# Patient Record
Sex: Male | Born: 1985 | Race: Black or African American | Hispanic: No | Marital: Single | Smoking: Current every day smoker
Health system: Southern US, Community
[De-identification: ages and names within clinical notes are randomized; demographics above are authoritative.]

## PROBLEM LIST (undated history)

## (undated) DIAGNOSIS — J449 Chronic obstructive pulmonary disease, unspecified: Secondary | ICD-10-CM

## (undated) DIAGNOSIS — F1010 Alcohol abuse, uncomplicated: Secondary | ICD-10-CM

## (undated) DIAGNOSIS — K259 Gastric ulcer, unspecified as acute or chronic, without hemorrhage or perforation: Secondary | ICD-10-CM

## (undated) DIAGNOSIS — M419 Scoliosis, unspecified: Secondary | ICD-10-CM

---

## 2016-12-26 ENCOUNTER — Emergency Department (HOSPITAL_COMMUNITY)
Admission: EM | Admit: 2016-12-26 | Discharge: 2016-12-26 | Disposition: A | Discharge status: Home / Self Care | Payer: Self-pay | Attending: Emergency Medicine | Admitting: Emergency Medicine

## 2016-12-26 ENCOUNTER — Emergency Department (HOSPITAL_COMMUNITY): Payer: Self-pay

## 2016-12-26 DIAGNOSIS — R0789 Other chest pain: Secondary | ICD-10-CM

## 2016-12-26 DIAGNOSIS — Z880 Allergy status to penicillin: Secondary | ICD-10-CM | POA: Insufficient documentation

## 2016-12-26 DIAGNOSIS — F410 Panic disorder [episodic paroxysmal anxiety] without agoraphobia: Secondary | ICD-10-CM

## 2016-12-26 LAB — BASIC METABOLIC PANEL
ANION GAP: 9 (ref 5–15)
BUN: 6 mg/dL (ref 6–20)
CHLORIDE: 103 mmol/L (ref 101–111)
CO2: 27 mmol/L (ref 22–32)
Calcium: 9.6 mg/dL (ref 8.9–10.3)
Creatinine, Ser: 1.17 mg/dL (ref 0.61–1.24)
GFR calc Af Amer: >60 mL/min (ref >60)
GLUCOSE: 99 mg/dL (ref 65–99)
POTASSIUM: 3.8 mmol/L (ref 3.5–5.1)
Sodium: 139 mmol/L (ref 135–145)

## 2016-12-26 LAB — CBC
HEMATOCRIT: 41.7 % (ref 39.0–52.0)
HEMOGLOBIN: 14.2 g/dL (ref 13.0–17.0)
MCH: 29.6 pg (ref 26.0–34.0)
MCHC: 34.1 g/dL (ref 30.0–36.0)
MCV: 86.9 fL (ref 78.0–100.0)
Platelets: 248 10*3/uL (ref 150–400)
RBC: 4.8 MIL/uL (ref 4.22–5.81)
RDW: 13.3 % (ref 11.5–15.5)
WBC: 5.3 10*3/uL (ref 4.0–10.5)

## 2016-12-26 LAB — I-STAT TROPONIN, ED: Troponin i, poc: 0 ng/mL (ref 0.00–0.08)

## 2016-12-26 MED ORDER — SODIUM CHLORIDE 0.9 % IV BOLUS (SEPSIS)
1000.0000 mL | Freq: Once | INTRAVENOUS | Status: AC
  Administered 2016-12-26: 1000 mL via INTRAVENOUS

## 2016-12-26 MED ORDER — HYDROXYZINE HCL 25 MG PO TABS: 25.0000 mg | ORAL_TABLET | Freq: Four times a day (QID) | ORAL | 0 refills | Status: DC | PRN

## 2016-12-26 MED ORDER — GI COCKTAIL ~~LOC~~
30.0000 mL | Freq: Once | ORAL | Status: AC
  Administered 2016-12-26: 30 mL via ORAL
  Filled 2016-12-26: qty 30

## 2016-12-26 MED ORDER — LORAZEPAM 2 MG/ML IJ SOLN
1.0000 mg | Freq: Once | INTRAMUSCULAR | Status: AC
  Administered 2016-12-26: 1 mg via INTRAVENOUS
  Filled 2016-12-26: qty 1

## 2016-12-26 NOTE — ED Triage Notes (Signed)
Pt from home via [facility]. Pt called out c/o central CP x1hr ago. Rates pain @ 6/10. Endorse n/v, weakness, & SHOB. Hx of diabetes, htn, & smoking. Ambulatory to bed. Given 324 ASA PTA by EMS. A&O x4.

## 2016-12-26 NOTE — ED Provider Notes (Signed)
[facility]-EMERGENCY DEPT Provider Note   CSN: [id number] Arrival date & time: 12/26/16  0617     History   Chief Complaint Chief Complaint  Patient presents with  . Chest Pain    HPI Monica BectonMichael Garden is a 31 y.o. male.  Pt presents to the ED today with CP.  He said it feels like he is having panic attacks that come and go.  He feels like he's going to die, then it gets hard to breathe.  Then he gets numb in his face and his hands "lock up."  Right now, he feels ok, but he thinks it might come back.  Triage nurse said pt has a hx of htn and diabetes.  Pt denies this to me.  He has not been here before.  He does smoke cigarettes.      No past medical history on file.  There are no active problems to display for this patient.   No past surgical history on file.     Home Medications    Prior to Admission medications   Medication Sig Start Date End Date Taking? Authorizing Provider  hydrOXYzine (ATARAX/VISTARIL) 25 MG tablet Take 1 tablet (25 mg total) by mouth every 6 (six) hours as needed for anxiety. 12/26/16   Jacalyn LefevreHaviland, Arneshia Ade, MD    Family History No family history on file.  Social History Social History  Substance Use Topics  . Smoking status: Not on file  . Smokeless tobacco: Not on file  . Alcohol use Not on file     Allergies   Penicillins   Review of Systems Review of Systems  Respiratory: Positive for shortness of breath.   Cardiovascular: Positive for chest pain.  Psychiatric/Behavioral: The patient is nervous/anxious.   All other systems reviewed and are negative.    Physical Exam Updated Vital Signs BP 115/75   Pulse 74   Temp 98.5 F (36.9 C) (Oral)   Resp (!) 28   Ht 6' (1.829 m)   Wt 83.9 kg (185 lb)   SpO2 100%   BMI 25.09 kg/m   Physical Exam  Constitutional: He is oriented to person, place, and time. He appears well-developed and well-nourished.  HENT:  Head: Normocephalic and atraumatic.  Right Ear: External ear normal.  Left  Ear: External ear normal.  Nose: Nose normal.  Mouth/Throat: Oropharynx is clear and moist.  Eyes: Pupils are equal, round, and reactive to light. Conjunctivae and EOM are normal.  Neck: Normal range of motion. Neck supple.  Cardiovascular: Normal rate, regular rhythm, normal heart sounds and intact distal pulses.   Pulmonary/Chest: Effort normal and breath sounds normal.  Abdominal: Soft. Bowel sounds are normal.  Musculoskeletal: Normal range of motion.  Neurological: He is alert and oriented to person, place, and time.  Skin: Skin is warm.  Psychiatric: He has a normal mood and affect. His behavior is normal. Judgment and thought content normal.  Nursing note and vitals reviewed.    ED Treatments / Results  Labs (all labs ordered are listed, but only abnormal results are displayed) Labs Reviewed  BASIC METABOLIC PANEL  CBC  I-STAT TROPONIN, ED    EKG  EKG Interpretation  Date/Time:  Monday December 26 2016 06:29:16 EDT Ventricular Rate:  67 PR Interval:    QRS Duration: 86 QT Interval:  362 QTC Calculation: 383 R Axis:   93 Text Interpretation:  Sinus rhythm Inferior infarct, acute (LCx) No old tracing to compare Confirmed by Jacalyn LefevreHaviland, Deeya Richeson [phone]([id number]) on 12/26/2016 7:03:36 AM Also  confirmed by Jacalyn LefevreHaviland, Charleene Callegari [phone]([id number]), editor Madalyn RobEverhart, Marilyn [phone])  on 12/26/2016 8:12:02 AM       Radiology Dg Chest 2 View  Result Date: 12/26/2016 CLINICAL DATA:  Chest pain. EXAM: CHEST  2 VIEW COMPARISON:  No prior . FINDINGS: Mediastinum and hilar structures normal. Heart size normal. No focal infiltrate. No pleural effusion or pneumothorax. IMPRESSION: No acute cardiopulmonary disease . Electronically Signed   By: Maisie Fushomas  Register   On: 12/26/2016 07:15    Procedures Procedures (including critical care time)  Medications Ordered in ED Medications  LORazepam (ATIVAN) injection 1 mg (1 mg Intravenous Given 12/26/16 0804)  sodium chloride 0.9 % bolus 1,000 mL (1,000 mLs Intravenous New  Bag/Given 12/26/16 0803)     Initial Impression / Assessment and Plan / ED Course  I have reviewed the triage vital signs and the nursing notes.  Pertinent labs & imaging results that were available during my care of the patient were reviewed by me and considered in my medical decision making (see chart for details).  Pt is feeling much better.  He wants to go home to charge his phone to talk to his girlfriend.  He was offered our landline, but wants to talk to her on an app.  Pt's cp is very atypical.  He is low risk for CAD.  Sx sound like panic attack.  Work up here is unremarkable.  He is encouraged to stop smoking.  Return if worse.  Final Clinical Impressions(s) / ED Diagnoses   Final diagnoses:  Atypical chest pain  Panic attack    New Prescriptions New Prescriptions   HYDROXYZINE (ATARAX/VISTARIL) 25 MG TABLET    Take 1 tablet (25 mg total) by mouth every 6 (six) hours as needed for anxiety.     Jacalyn LefevreHaviland, Leighanna Kirn, MD 12/26/16 [phone]

## 2017-06-01 ENCOUNTER — Encounter (HOSPITAL_COMMUNITY): Payer: Self-pay | Admitting: Emergency Medicine

## 2017-06-01 ENCOUNTER — Emergency Department (HOSPITAL_COMMUNITY)
Admission: EM | Admit: 2017-06-01 | Discharge: 2017-06-01 | Discharge status: Against Medical Advice | Payer: Self-pay | Attending: Emergency Medicine | Admitting: Emergency Medicine

## 2017-06-01 ENCOUNTER — Other Ambulatory Visit: Payer: Self-pay

## 2017-06-01 DIAGNOSIS — Z5321 Procedure and treatment not carried out due to patient leaving prior to being seen by health care provider: Secondary | ICD-10-CM | POA: Insufficient documentation

## 2017-06-01 DIAGNOSIS — J069 Acute upper respiratory infection, unspecified: Secondary | ICD-10-CM | POA: Insufficient documentation

## 2017-06-01 HISTORY — DX: Gastric ulcer, unspecified as acute or chronic, without hemorrhage or perforation: K25.9
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
## 2017-06-01 NOTE — ED Notes (Signed)
Unable to locate patient in lobby x 2 

## 2017-06-01 NOTE — ED Notes (Signed)
Patient no answer x2 when called for triage.

## 2017-06-06 ENCOUNTER — Emergency Department (HOSPITAL_COMMUNITY): Payer: Self-pay

## 2017-06-06 ENCOUNTER — Other Ambulatory Visit: Payer: Self-pay

## 2017-06-06 ENCOUNTER — Encounter (HOSPITAL_COMMUNITY): Payer: Self-pay | Admitting: Emergency Medicine

## 2017-06-06 DIAGNOSIS — F17200 Nicotine dependence, unspecified, uncomplicated: Secondary | ICD-10-CM | POA: Insufficient documentation

## 2017-06-06 DIAGNOSIS — J111 Influenza due to unidentified influenza virus with other respiratory manifestations: Secondary | ICD-10-CM | POA: Insufficient documentation

## 2017-06-06 LAB — COMPREHENSIVE METABOLIC PANEL
ALK PHOS: 116 U/L (ref 38–126)
ALT: 33 U/L (ref 17–63)
ANION GAP: 8 (ref 5–15)
AST: 31 U/L (ref 15–41)
Albumin: 4 g/dL (ref 3.5–5.0)
BILIRUBIN TOTAL: 1.6 mg/dL — AB (ref 0.3–1.2)
BUN: 6 mg/dL (ref 6–20)
CALCIUM: 9.6 mg/dL (ref 8.9–10.3)
CO2: 27 mmol/L (ref 22–32)
CREATININE: 1.14 mg/dL (ref 0.61–1.24)
Chloride: 101 mmol/L (ref 101–111)
GFR calc non Af Amer: >60 mL/min (ref >60)
Glucose, Bld: 95 mg/dL (ref 65–99)
Potassium: 3.7 mmol/L (ref 3.5–5.1)
Sodium: 136 mmol/L (ref 135–145)
TOTAL PROTEIN: 7.6 g/dL (ref 6.5–8.1)

## 2017-06-06 LAB — CBC
HCT: 42.6 % (ref 39.0–52.0)
Hemoglobin: 14.2 g/dL (ref 13.0–17.0)
MCH: 30 pg (ref 26.0–34.0)
MCHC: 33.3 g/dL (ref 30.0–36.0)
MCV: 89.9 fL (ref 78.0–100.0)
PLATELETS: 272 10*3/uL (ref 150–400)
RBC: 4.74 MIL/uL (ref 4.22–5.81)
RDW: 13.2 % (ref 11.5–15.5)
WBC: 8.7 10*3/uL (ref 4.0–10.5)

## 2017-06-06 LAB — URINALYSIS, ROUTINE W REFLEX MICROSCOPIC
BILIRUBIN URINE: NEGATIVE
Glucose, UA: NEGATIVE mg/dL
Hgb urine dipstick: NEGATIVE
Ketones, ur: NEGATIVE mg/dL
Leukocytes, UA: NEGATIVE
NITRITE: NEGATIVE
PROTEIN: NEGATIVE mg/dL
SPECIFIC GRAVITY, URINE: 1.016 (ref 1.005–1.030)
pH: 8 (ref 5.0–8.0)

## 2017-06-06 LAB — LIPASE, BLOOD: Lipase: 26 U/L (ref 11–51)

## 2017-06-06 NOTE — ED Triage Notes (Signed)
Per EMS, pt has been feeling "sick" x 2 weeks. Pt c/o nausea, vomiting, diarrhea and a productive cough.

## 2017-06-07 ENCOUNTER — Emergency Department (HOSPITAL_COMMUNITY)
Admission: EM | Admit: 2017-06-07 | Discharge: 2017-06-07 | Disposition: A | Discharge status: Home / Self Care | Payer: Self-pay | Attending: Emergency Medicine | Admitting: Emergency Medicine

## 2017-06-07 DIAGNOSIS — R6889 Other general symptoms and signs: Secondary | ICD-10-CM

## 2017-06-07 MED ORDER — PREDNISONE 20 MG PO TABS: 40.0000 mg | ORAL_TABLET | Freq: Every day | ORAL | 0 refills | Status: DC

## 2017-06-07 MED ORDER — GUAIFENESIN-CODEINE 100-10 MG/5ML PO SYRP: 5.0000 mL | ORAL_SOLUTION | Freq: Three times a day (TID) | ORAL | 0 refills | Status: DC | PRN

## 2017-06-07 MED ORDER — ACETAMINOPHEN 325 MG PO TABS
650.0000 mg | ORAL_TABLET | Freq: Once | ORAL | Status: AC
Start: 2017-06-07 — End: 2017-06-07
  Administered 2017-06-07: 650 mg via ORAL
  Filled 2017-06-07: qty 2

## 2017-06-07 MED ORDER — IBUPROFEN 400 MG PO TABS
600.0000 mg | ORAL_TABLET | Freq: Once | ORAL | Status: AC
Start: 2017-06-07 — End: 2017-06-07
  Administered 2017-06-07: 600 mg via ORAL
  Filled 2017-06-07: qty 1

## 2017-06-07 MED ORDER — ONDANSETRON 4 MG PO TBDP
4.0000 mg | ORAL_TABLET | Freq: Once | ORAL | Status: AC
Start: 2017-06-07 — End: 2017-06-07
  Administered 2017-06-07: 4 mg via ORAL
  Filled 2017-06-07: qty 1

## 2017-06-07 MED ORDER — CYCLOBENZAPRINE HCL 10 MG PO TABS: 10.0000 mg | ORAL_TABLET | Freq: Two times a day (BID) | ORAL | 0 refills | Status: DC | PRN

## 2017-06-07 MED ORDER — CYCLOBENZAPRINE HCL 10 MG PO TABS
5.0000 mg | ORAL_TABLET | Freq: Once | ORAL | Status: AC
  Administered 2017-06-07: 5 mg via ORAL
  Filled 2017-06-07: qty 1

## 2017-06-07 MED ORDER — KETOROLAC TROMETHAMINE 60 MG/2ML IM SOLN
60.0000 mg | Freq: Once | INTRAMUSCULAR | Status: AC
  Administered 2017-06-07: 60 mg via INTRAMUSCULAR
  Filled 2017-06-07: qty 2

## 2017-06-07 NOTE — ED Notes (Signed)
Pt called for vitals reassessment, no response.  

## 2017-06-07 NOTE — ED Notes (Signed)
PT in bed taking rapid, shallow breaths and crying. PT reports pain has worsened. PT accepts PO meds.

## 2017-06-07 NOTE — Discharge Instructions (Signed)
Your lab work and chest xray were reassuring.   Please take ibuprofen at home as needed for body aches. You can take tylenol as needed for fever and chills.   Please drink plenty of fluids and rest as much as you can.   I have written you a prescription for a steroid medicine called prednisone. Please take 40mg  daily for the next 5 days.   I have also written you a prescription for muscle relaxer medicine called Flexeril.  This medicine can make you drowsy so please do not drive, work or drink alcohol while taking it.  I [facility].  They are a clinic located across the street and accept patients who do not have insurance.  Please call to establish care.  Return to the emergency department if you develop worsening cough with shortness of breath, vomiting that will not stop or have any new or worsening symptoms.

## 2017-06-07 NOTE — ED Provider Notes (Signed)
[facility] EMERGENCY DEPARTMENT Provider Note   CSN: [id number] Arrival date & time: 06/06/17  1939     History   Chief Complaint Chief Complaint  Patient presents with  . Nausea    HPI Steven Mooney is a 32 y.o. male.  HPI   Steven Mooney is a 32 year old male with a history of anxiety who presents to the emergency department for evaluation of cough, congestion, fever, body aches, sore throat, nausea and vomiting.  Patient states that he has felt sick for the past 2 weeks now.  Reports that his cough is productive of a brown/yellow sputum.  He also endorses chills and tactile fever at home.  His body aches are primarily located over the bilateral back and legs.  Reports that for the last 3 days he has had nausea with several episodes of vomiting after trying to eat or drink.  States that his sore throat is intermittent. He has not taken any over the counter medications for his symptoms. States that he also has several close contacts who are sick.  Denies headache, ear pain, trismus, dysphagia, chest pain, shortness of breath, abdominal pain, dysuria, urinary frequency, loss of bowel or bladder control, numbness. He states he feels generally weak and fatigued overall.   Past Medical History:  Diagnosis Date  . Gastric ulcer     There are no active problems to display for this patient.   History reviewed. No pertinent surgical history.     Home Medications    Prior to Admission medications   Medication Sig Start Date End Date Taking? Authorizing Provider  hydrOXYzine (ATARAX/VISTARIL) 25 MG tablet Take 1 tablet (25 mg total) by mouth every 6 (six) hours as needed for anxiety. 12/26/16   Jacalyn Lefevre, MD    Family History No family history on file.  Social History Social History   Tobacco Use  . Smoking status: Current Every Day Smoker  . Smokeless tobacco: Never Used  Substance Use Topics  . Alcohol use: Yes  . Drug use: Yes    Types:  Marijuana     Allergies   Penicillins   Review of Systems Review of Systems  Constitutional: Positive for chills, fatigue and fever.  HENT: Positive for congestion, rhinorrhea and sore throat. Negative for ear pain, facial swelling, nosebleeds, trouble swallowing and voice change.   Eyes: Negative for visual disturbance.  Respiratory: Positive for cough. Negative for shortness of breath and wheezing.   Cardiovascular: Negative for chest pain.  Gastrointestinal: Positive for nausea and vomiting. Negative for abdominal pain and diarrhea.  Genitourinary: Negative for difficulty urinating, dysuria and flank pain.  Musculoskeletal: Positive for back pain and myalgias (generalized body aches). Negative for gait problem.  Skin: Negative for rash.  Neurological: Negative for headaches.  Psychiatric/Behavioral: Negative for agitation.     Physical Exam Updated Vital Signs BP 127/80   Pulse 88   Temp 98.7 F (37.1 C) (Oral)   Resp 13   Ht 6' (1.829 m)   Wt 83 kg (183 lb)   SpO2 98%   BMI 24.82 kg/m   Physical Exam  Constitutional: He is oriented to person, place, and time. He appears well-developed and well-nourished. No distress.  HENT:  Head: Normocephalic and atraumatic.  Right Ear: External ear normal.  Left Ear: External ear normal.  Mouth/Throat: No oropharyngeal exudate.  Bilateral TMs with good cone of light. Mucous membranes moist. Posterior oropharynx non-erythematous. No tonsillar exudate. Uvula midline. Airway patent. Able to handle oral secretions.  Eyes: Conjunctivae are normal. Pupils are equal, round, and reactive to light. Right eye exhibits no discharge. Left eye exhibits no discharge. No scleral icterus.  Neck: Normal range of motion. Neck supple.  Cardiovascular: Normal rate, regular rhythm and intact distal pulses. Exam reveals no friction rub.  No murmur heard. Pulmonary/Chest: Effort normal and breath sounds normal. No stridor. No respiratory distress.  He has no wheezes. He has no rales.  Abdominal: Soft. Bowel sounds are normal. There is no tenderness. There is no guarding.  Musculoskeletal: Normal range of motion.  No midline T-spine or L-spine tenderness. Grossly tender to palpation over the bilateral upper and lower back. No rash, bruising or break in skin. Strength 5/5 in bilateral knee flexion/extension and ankle dorsiflexion/plantar flexion. DP pulses 2+ bilaterally.   Lymphadenopathy:    He has no cervical adenopathy.  Neurological: He is alert and oriented to person, place, and time. Coordination normal.  Patellar reflex 2+ bilaterally. Sensation to light touch intact in bilateral distal LE. Gait normal in balance and coordination.   Skin: Skin is warm and dry. Capillary refill takes less than 2 seconds. He is not diaphoretic.  Psychiatric: He has a normal mood and affect. His behavior is normal.  Nursing note and vitals reviewed.    ED Treatments / Results  Labs (all labs ordered are listed, but only abnormal results are displayed) Labs Reviewed  COMPREHENSIVE METABOLIC PANEL - Abnormal; Notable for the following components:      Result Value   Total Bilirubin 1.6 (*)    All other components within normal limits  LIPASE, BLOOD  CBC  URINALYSIS, ROUTINE W REFLEX MICROSCOPIC    EKG  EKG Interpretation None       Radiology Dg Chest 2 View  Result Date: 06/06/2017 CLINICAL DATA:  Nausea, vomiting and productive cough EXAM: CHEST  2 VIEW COMPARISON:  12/26/2016 FINDINGS: Normal heart size. Right aortic arch. Otherwise unremarkable mediastinal contours. The lungs are clear. Mild S-shaped scoliosis. IMPRESSION: 1. Clear lungs. 2. Right aortic arch. Electronically Signed   By: Marnee SpringJonathon  Watts M.D.   On: 06/06/2017 23:07    Procedures Procedures (including critical care time)  Medications Ordered in ED Medications - No data to display   Initial Impression / Assessment and Plan / ED Course  I have reviewed the triage  vital signs and the nursing notes.  Pertinent labs & imaging results that were available during my care of the patient were reviewed by me and considered in my medical decision making (see chart for details).    Patient with symptoms consistent with influenza.  Vitals are stable, low-grade fever.  No signs of dehydration, tolerating PO's.  Lungs are clear. Chest Xray without acute abnormality.  Discussed the cost versus benefit of Tamiflu treatment with the patient.  The patient understands that symptoms are greater than the recommended 24-48 hour window of treatment.  Patient will be discharged with instructions to orally hydrate, rest, and use over-the-counter medications such as anti-inflammatories ibuprofen and Aleve for muscle aches and Tylenol for fever. Patient will also be given a cough suppressant.  He does not have a primary care doctor, have given him information to follow-up and [facility] at [facility].  Discussed return precautions the patient agrees and voiced understanding.  Final Clinical Impressions(s) / ED Diagnoses   Final diagnoses:  Flu-like symptoms    ED Discharge Orders    None       Kellie ShropshireShrosbree, Kymoni Monday J, PA-C 06/07/17 2052  Phillis Haggis, MD 06/08/17 [phone]

## 2017-06-11 ENCOUNTER — Emergency Department (HOSPITAL_COMMUNITY)
Admission: EM | Admit: 2017-06-11 | Discharge: 2017-06-11 | Disposition: A | Discharge status: Home / Self Care | Payer: Self-pay | Attending: Emergency Medicine | Admitting: Emergency Medicine

## 2017-06-11 ENCOUNTER — Encounter (HOSPITAL_COMMUNITY): Payer: Self-pay | Admitting: *Deleted

## 2017-06-11 ENCOUNTER — Other Ambulatory Visit: Payer: Self-pay

## 2017-06-11 DIAGNOSIS — Z79899 Other long term (current) drug therapy: Secondary | ICD-10-CM | POA: Insufficient documentation

## 2017-06-11 DIAGNOSIS — K625 Hemorrhage of anus and rectum: Secondary | ICD-10-CM | POA: Insufficient documentation

## 2017-06-11 DIAGNOSIS — F17210 Nicotine dependence, cigarettes, uncomplicated: Secondary | ICD-10-CM | POA: Insufficient documentation

## 2017-06-11 LAB — URINALYSIS, ROUTINE W REFLEX MICROSCOPIC
BILIRUBIN URINE: NEGATIVE
Glucose, UA: NEGATIVE mg/dL
Hgb urine dipstick: NEGATIVE
KETONES UR: NEGATIVE mg/dL
LEUKOCYTES UA: NEGATIVE
NITRITE: NEGATIVE
PROTEIN: NEGATIVE mg/dL
Specific Gravity, Urine: 1.001 — ABNORMAL LOW (ref 1.005–1.030)
pH: 6 (ref 5.0–8.0)

## 2017-06-11 LAB — CBC
HCT: 43.7 % (ref 39.0–52.0)
Hemoglobin: 14.9 g/dL (ref 13.0–17.0)
MCH: 31.2 pg (ref 26.0–34.0)
MCHC: 34.1 g/dL (ref 30.0–36.0)
MCV: 91.6 fL (ref 78.0–100.0)
Platelets: 291 10*3/uL (ref 150–400)
RBC: 4.77 MIL/uL (ref 4.22–5.81)
RDW: 13.5 % (ref 11.5–15.5)
WBC: 12 10*3/uL — AB (ref 4.0–10.5)

## 2017-06-11 LAB — COMPREHENSIVE METABOLIC PANEL
ALK PHOS: 115 U/L (ref 38–126)
ALT: 27 U/L (ref 17–63)
ANION GAP: 14 (ref 5–15)
AST: 20 U/L (ref 15–41)
Albumin: 3.9 g/dL (ref 3.5–5.0)
BILIRUBIN TOTAL: 0.7 mg/dL (ref 0.3–1.2)
BUN: 7 mg/dL (ref 6–20)
CALCIUM: 9.3 mg/dL (ref 8.9–10.3)
CO2: 25 mmol/L (ref 22–32)
Chloride: 99 mmol/L — ABNORMAL LOW (ref 101–111)
Creatinine, Ser: 1.21 mg/dL (ref 0.61–1.24)
GFR calc Af Amer: >60 mL/min (ref >60)
GLUCOSE: 114 mg/dL — AB (ref 65–99)
Potassium: 3.3 mmol/L — ABNORMAL LOW (ref 3.5–5.1)
Sodium: 138 mmol/L (ref 135–145)
TOTAL PROTEIN: 6.8 g/dL (ref 6.5–8.1)

## 2017-06-11 LAB — POC OCCULT BLOOD, ED: FECAL OCCULT BLD: NEGATIVE

## 2017-06-11 LAB — LIPASE, BLOOD: LIPASE: 35 U/L (ref 11–51)

## 2017-06-11 NOTE — Discharge Instructions (Signed)
Please start taking miralax daily, this is available over the counter.   Please stop taking any ibuprofen until this resolves.  If you develop abdominal pain, nausea, vomiting, or have any concerns please return to the emergency room.

## 2017-06-11 NOTE — ED Provider Notes (Signed)
[facility] EMERGENCY DEPARTMENT Provider Note   CSN: [id number] Arrival date & time: 06/11/17  0046     History   Chief Complaint Chief Complaint  Patient presents with  . Abdominal Pain    HPI Steven Mooney is a 32 y.o. male who presents today for evaluation of blood in his stool.  He reports that for the past month he has had blood in his stool, and notes that today he had a bowel movement that was reportedly only blood without stool.  He denies any abdominal pain, states that he has back pain that is been present for multiple weeks.  He denies any lightheadedness or dizziness.  No recent trauma.  He denies any diarrhea or constipation.  He does have a remote history of gastric ulcers, however says that that was "years ago" and that this does not feel similar.  He does report that he was recently diagnosed with influenza-like illness which has been getting better.  No fevers or chills, no dysuria hematuria, or other urinary concerns.  He denies any trauma history.  Denies any rectal insertions. HPI  Past Medical History:  Diagnosis Date  . Gastric ulcer     There are no active problems to display for this patient.   History reviewed. No pertinent surgical history.     Home Medications    Prior to Admission medications   Medication Sig Start Date End Date Taking? Authorizing Provider  cyclobenzaprine (FLEXERIL) 10 MG tablet Take 1 tablet (10 mg total) by mouth 2 (two) times daily as needed for muscle spasms. Patient not taking: Reported on 06/11/2017 06/07/17   Kellie Shropshire, PA-C  guaiFENesin-codeine (ROBITUSSIN AC) 100-10 MG/5ML syrup Take 5 mLs by mouth 3 (three) times daily as needed for cough. Patient not taking: Reported on 06/11/2017 06/07/17   Kellie Shropshire, PA-C  hydrOXYzine (ATARAX/VISTARIL) 25 MG tablet Take 1 tablet (25 mg total) by mouth every 6 (six) hours as needed for anxiety. Patient not taking: Reported on 06/07/2017 12/26/16    Jacalyn Lefevre, MD  predniSONE (DELTASONE) 20 MG tablet Take 2 tablets (40 mg total) by mouth daily. Patient not taking: Reported on 06/11/2017 06/07/17   Kellie Shropshire, PA-C    Family History No family history on file.  Social History Social History   Tobacco Use  . Smoking status: Current Every Day Smoker  . Smokeless tobacco: Never Used  Substance Use Topics  . Alcohol use: Yes  . Drug use: Yes    Types: Marijuana     Allergies   Penicillins   Review of Systems Review of Systems  Constitutional: Negative for chills and fever.  HENT: Negative for ear pain and sore throat.   Eyes: Negative for pain and visual disturbance.  Respiratory: Negative for cough and shortness of breath.   Cardiovascular: Negative for chest pain and palpitations.  Gastrointestinal: Positive for abdominal pain and blood in stool. Negative for vomiting.  Genitourinary: Negative for dysuria and hematuria.  Musculoskeletal: Negative for arthralgias and back pain.  Skin: Negative for color change and rash.  Neurological: Negative for seizures and syncope.  All other systems reviewed and are negative.    Physical Exam Updated Vital Signs BP 125/62   Pulse 94   Temp 98.2 F (36.8 C) (Oral)   Resp 16   SpO2 98%   Physical Exam  Constitutional: He appears well-developed and well-nourished.  HENT:  Head: Normocephalic and atraumatic.  Eyes: Conjunctivae are normal.  Neck: Neck supple.  Cardiovascular: Normal rate and regular rhythm.  No murmur heard. Pulmonary/Chest: Effort normal and breath sounds normal. No respiratory distress.  Abdominal: Soft. Normal appearance and bowel sounds are normal. There is no tenderness. There is no rigidity, no rebound and no guarding.  There is tenderness to palpation over lower thoracic/upper lumbar back that is over the CVA areas, however pain is present with light palpation, and is worse with pressure then percussion.  These muscles feel tight  consistent with spasm.  Genitourinary:  Genitourinary Comments: Exam performed with chaperone in room.  There are no external hemorrhoids or fissures/skin tears.  No masses or abnormalities felt with DRE.  Hemoccult negative.  No frank blood or melena.  Musculoskeletal: He exhibits no edema.  Neurological: He is alert.  Skin: Skin is warm and dry.  Psychiatric: He has a normal mood and affect.  Nursing note and vitals reviewed.    ED Treatments / Results  Labs (all labs ordered are listed, but only abnormal results are displayed) Labs Reviewed  COMPREHENSIVE METABOLIC PANEL - Abnormal; Notable for the following components:      Result Value   Potassium 3.3 (*)    Chloride 99 (*)    Glucose, Bld 114 (*)    All other components within normal limits  CBC - Abnormal; Notable for the following components:   WBC 12.0 (*)    All other components within normal limits  URINALYSIS, ROUTINE W REFLEX MICROSCOPIC - Abnormal; Notable for the following components:   Color, Urine STRAW (*)    Specific Gravity, Urine 1.001 (*)    All other components within normal limits  LIPASE, BLOOD  OCCULT BLOOD X 1 CARD TO LAB, STOOL  POC OCCULT BLOOD, ED    EKG  EKG Interpretation None       Radiology No results found.  Procedures Procedures (including critical care time)  Medications Ordered in ED Medications - No data to display   Initial Impression / Assessment and Plan / ED Course  I have reviewed the triage vital signs and the nursing notes.  Pertinent labs & imaging results that were available during my care of the patient were reviewed by me and considered in my medical decision making (see chart for details).    Steven Mooney presents today for evaluation of bright red blood per rectum.  Hemoglobin Is normal, Hemoccult negative.  He does not have any abdominal pain or tenderness.  His reported "kidney" pain appears more consistent with paraspinal muscle spasm as the pain is  worse with pressure than with percussion.  Urine without hematuria or abnormalities.  Given no external source of bleeding, with able hemoglobin I feel that patient is appropriate for outpatient GI follow-up.  He is hemodynamically stable at this time.  CT scan abdomen was considered, however given lack of abdominal pain, emergent CT not indicated.  He was given instructions on MiraLAX to help soften bowel movements and GI follow-up.  He was given return precautions, states his understanding. Patient discussed with Dr. Bebe ShaggyWickline who agrees with my plan.   Final Clinical Impressions(s) / ED Diagnoses   Final diagnoses:  Bright red blood per rectum    ED Discharge Orders    None       Cristina GongHammond, Vencent Hauschild W, PA-C 06/11/17 0344    Zadie RhineWickline, Donald, MD 06/11/17 [phone]

## 2017-06-11 NOTE — ED Triage Notes (Signed)
Pt BIB  EMS c/o blood in stool. Has been having diarrhea, Reports bright red blood in the toilet tonight without stool. Denies abd pain, is having back pain

## 2017-09-25 ENCOUNTER — Encounter (HOSPITAL_COMMUNITY): Payer: Self-pay | Admitting: Emergency Medicine

## 2017-09-25 ENCOUNTER — Emergency Department (HOSPITAL_COMMUNITY): Payer: Self-pay

## 2017-09-25 ENCOUNTER — Emergency Department (HOSPITAL_COMMUNITY)
Admission: EM | Admit: 2017-09-25 | Discharge: 2017-09-25 | Disposition: A | Discharge status: Home / Self Care | Payer: Self-pay | Attending: Emergency Medicine | Admitting: Emergency Medicine

## 2017-09-25 DIAGNOSIS — M62830 Muscle spasm of back: Secondary | ICD-10-CM | POA: Insufficient documentation

## 2017-09-25 DIAGNOSIS — R112 Nausea with vomiting, unspecified: Secondary | ICD-10-CM | POA: Insufficient documentation

## 2017-09-25 DIAGNOSIS — M546 Pain in thoracic spine: Secondary | ICD-10-CM | POA: Insufficient documentation

## 2017-09-25 DIAGNOSIS — K920 Hematemesis: Secondary | ICD-10-CM

## 2017-09-25 DIAGNOSIS — F17200 Nicotine dependence, unspecified, uncomplicated: Secondary | ICD-10-CM | POA: Insufficient documentation

## 2017-09-25 DIAGNOSIS — R1011 Right upper quadrant pain: Secondary | ICD-10-CM | POA: Insufficient documentation

## 2017-09-25 DIAGNOSIS — K2920 Alcoholic gastritis without bleeding: Secondary | ICD-10-CM | POA: Insufficient documentation

## 2017-09-25 LAB — COMPREHENSIVE METABOLIC PANEL
ALBUMIN: 4.7 g/dL (ref 3.5–5.0)
ALK PHOS: 72 U/L (ref 38–126)
ALT: 23 U/L (ref 17–63)
ANION GAP: 10 (ref 5–15)
AST: 41 U/L (ref 15–41)
BILIRUBIN TOTAL: 1.3 mg/dL — AB (ref 0.3–1.2)
BUN: 11 mg/dL (ref 6–20)
CALCIUM: 9.7 mg/dL (ref 8.9–10.3)
CO2: 26 mmol/L (ref 22–32)
CREATININE: 1.18 mg/dL (ref 0.61–1.24)
Chloride: 104 mmol/L (ref 101–111)
GFR calc Af Amer: >60 mL/min (ref >60)
GFR calc non Af Amer: >60 mL/min (ref >60)
GLUCOSE: 92 mg/dL (ref 65–99)
Potassium: 3.7 mmol/L (ref 3.5–5.1)
Sodium: 140 mmol/L (ref 135–145)
TOTAL PROTEIN: 8.7 g/dL — AB (ref 6.5–8.1)

## 2017-09-25 LAB — URINALYSIS, ROUTINE W REFLEX MICROSCOPIC
Bacteria, UA: NONE SEEN
Bilirubin Urine: NEGATIVE
Glucose, UA: NEGATIVE mg/dL
Ketones, ur: 5 mg/dL — AB
NITRITE: NEGATIVE
PROTEIN: NEGATIVE mg/dL
SPECIFIC GRAVITY, URINE: 1.021 (ref 1.005–1.030)
pH: 7 (ref 5.0–8.0)

## 2017-09-25 LAB — CBC
HCT: 42.6 % (ref 39.0–52.0)
HEMATOCRIT: 42.9 % (ref 39.0–52.0)
HEMOGLOBIN: 14.8 g/dL (ref 13.0–17.0)
Hemoglobin: 14.7 g/dL (ref 13.0–17.0)
MCH: 31.1 pg (ref 26.0–34.0)
MCH: 31 pg (ref 26.0–34.0)
MCHC: 34.5 g/dL (ref 30.0–36.0)
MCHC: 34.5 g/dL (ref 30.0–36.0)
MCV: 90.1 fL (ref 78.0–100.0)
MCV: 89.7 fL (ref 78.0–100.0)
PLATELETS: 263 10*3/uL (ref 150–400)
Platelets: 251 10*3/uL (ref 150–400)
RBC: 4.73 MIL/uL (ref 4.22–5.81)
RBC: 4.78 MIL/uL (ref 4.22–5.81)
RDW: 13.6 % (ref 11.5–15.5)
RDW: 13.7 % (ref 11.5–15.5)
WBC: 7 10*3/uL (ref 4.0–10.5)
WBC: 8.2 10*3/uL (ref 4.0–10.5)

## 2017-09-25 LAB — LIPASE, BLOOD: Lipase: 28 U/L (ref 11–51)

## 2017-09-25 MED ORDER — FAMOTIDINE 20 MG PO TABS
20.0000 mg | ORAL_TABLET | Freq: Once | ORAL | Status: AC
  Administered 2017-09-25: 20 mg via ORAL
  Filled 2017-09-25: qty 1

## 2017-09-25 MED ORDER — CYCLOBENZAPRINE HCL 10 MG PO TABS: 10.0000 mg | ORAL_TABLET | Freq: Three times a day (TID) | ORAL | 0 refills | Status: DC | PRN

## 2017-09-25 MED ORDER — RANITIDINE HCL 150 MG PO TABS: 150.0000 mg | ORAL_TABLET | Freq: Two times a day (BID) | ORAL | 0 refills | Status: DC

## 2017-09-25 MED ORDER — GI COCKTAIL ~~LOC~~
30.0000 mL | Freq: Once | ORAL | Status: AC
  Administered 2017-09-25: 30 mL via ORAL
  Filled 2017-09-25: qty 30

## 2017-09-25 MED ORDER — ONDANSETRON 4 MG PO TBDP: 4.0000 mg | ORAL_TABLET | Freq: Three times a day (TID) | ORAL | 0 refills | Status: DC | PRN

## 2017-09-25 MED ORDER — ONDANSETRON 8 MG PO TBDP
8.0000 mg | ORAL_TABLET | Freq: Once | ORAL | Status: AC
  Administered 2017-09-25: 8 mg via ORAL
  Filled 2017-09-25: qty 1

## 2017-09-25 NOTE — ED Notes (Signed)
Called Pt in lobby for vital recheck and urine sample collection. No response in lobby x1.

## 2017-09-25 NOTE — ED Provider Notes (Signed)
[facility]-EMERGENCY DEPT Provider Note   CSN: [id number] Arrival date & time: 09/25/17  1143     History   Chief Complaint Chief Complaint  Patient presents with  . Emesis  . Nausea    HPI Steven Mooney is a 32 y.o. male with a PMHx of gastric ulcer, who presents to the ED with complaints of nausea, vomiting, and upper back pain that began this morning after waking up from a night of drinking excessive amounts of alcohol.  Patient states that he had 2 handles of whiskey by himself last night starting around 8 PM, went to sleep and then this morning when he woke up he started having upper back pain which he describes as 8/10 constant stabbing nonradiating back pain which worsens with laying down and with no treatments tried prior to arrival and no known alleviating factors.  He also reports having nausea and too numerous to count episodes of emesis which he states had bright red blood mixed in with it as well as hematemesis separate from the stomach contents/emesis contents.  He states that he "always has blood in his vomit", denies any passage of clots or significant coffee-ground emesis today.  He states he threw up just before he got here but hasn't vomited since then (he's been here for about 6hrs).  He denies any injury to his back or any reason for his back pain.  He denies any fevers, chills, chest pain, shortness breath, abdominal pain, diarrhea, constipation, melena, hematochezia, obstipation, dysuria, hematuria, saddle anesthesia or cauda equina symptoms, incontinence of urine or stool, numbness, tingling, focal weakness, or any other complaints at this time.  He denies any recent travel, sick contacts, suspicious food intake, NSAID use, or prior abdominal surgeries.  The history is provided by the patient and medical records. No language interpreter was used.  Emesis   Pertinent negatives include no abdominal pain, no arthralgias, no chills, no diarrhea, no  fever and no myalgias.    Past Medical History:  Diagnosis Date  . Gastric ulcer     There are no active problems to display for this patient.   History reviewed. No pertinent surgical history.      Home Medications    Prior to Admission medications   Medication Sig Start Date End Date Taking? Authorizing Provider  cyclobenzaprine (FLEXERIL) 10 MG tablet Take 1 tablet (10 mg total) by mouth 2 (two) times daily as needed for muscle spasms. Patient not taking: Reported on 06/11/2017 06/07/17   Kellie Shropshire, PA-C  guaiFENesin-codeine (ROBITUSSIN AC) 100-10 MG/5ML syrup Take 5 mLs by mouth 3 (three) times daily as needed for cough. Patient not taking: Reported on 06/11/2017 06/07/17   Kellie Shropshire, PA-C  hydrOXYzine (ATARAX/VISTARIL) 25 MG tablet Take 1 tablet (25 mg total) by mouth every 6 (six) hours as needed for anxiety. Patient not taking: Reported on 06/07/2017 12/26/16   Jacalyn Lefevre, MD  predniSONE (DELTASONE) 20 MG tablet Take 2 tablets (40 mg total) by mouth daily. Patient not taking: Reported on 06/11/2017 06/07/17   Kellie Shropshire, PA-C    Family History No family history on file.  Social History Social History   Tobacco Use  . Smoking status: Current Every Day Smoker  . Smokeless tobacco: Never Used  Substance Use Topics  . Alcohol use: Yes  . Drug use: Yes    Types: Marijuana     Allergies   Penicillins   Review of Systems Review of Systems  Constitutional: Negative  for chills and fever.  Respiratory: Negative for shortness of breath.   Cardiovascular: Negative for chest pain.  Gastrointestinal: Positive for nausea and vomiting. Negative for abdominal pain, blood in stool, constipation and diarrhea.  Genitourinary: Negative for difficulty urinating (no incontinence), dysuria and hematuria.  Musculoskeletal: Positive for back pain. Negative for arthralgias and myalgias.  Skin: Negative for color change.  Allergic/Immunologic: Negative for  immunocompromised state.  Neurological: Negative for weakness and numbness.  Psychiatric/Behavioral: Negative for confusion.   All other systems reviewed and are negative for acute change except as noted in the HPI.    Physical Exam Updated Vital Signs BP 120/87 (BP Location: Left Arm)   Pulse 74   Temp 97.9 F (36.6 C) (Oral)   Resp 16   SpO2 98%   Physical Exam  Constitutional: He is oriented to person, place, and time. Vital signs are normal. He appears well-developed and well-nourished.  Non-toxic appearance. No distress.  Afebrile, nontoxic, NAD  HENT:  Head: Normocephalic and atraumatic.  Mouth/Throat: Oropharynx is clear and moist and mucous membranes are normal.  Eyes: Conjunctivae and EOM are normal. Right eye exhibits no discharge. Left eye exhibits no discharge.  Neck: Normal range of motion. Neck supple.  Cardiovascular: Normal rate, regular rhythm, normal heart sounds and intact distal pulses. Exam reveals no gallop and no friction rub.  No murmur heard. Pulmonary/Chest: Effort normal and breath sounds normal. No respiratory distress. He has no decreased breath sounds. He has no wheezes. He has no rhonchi. He has no rales.  Abdominal: Soft. Normal appearance and bowel sounds are normal. He exhibits no distension. There is tenderness in the epigastric area. There is no rigidity, no rebound, no guarding, no CVA tenderness, no tenderness at McBurney's point and negative Murphy's sign.  Soft, nondistended, +BS throughout, with mild epigastric TTP, no r/g/r, neg murphy's, neg mcburney's, no CVA TTP   Musculoskeletal: Normal range of motion.       Thoracic back: He exhibits tenderness and spasm. He exhibits normal range of motion and no bony tenderness.       Back:  Thoracic spine with FROM intact without spinous process TTP, no bony stepoffs or deformities, with mild R sided paraspinous muscle TTP and muscle spasms. Strength and sensation grossly intact in all extremities,  gait steady and nonantalgic. No overlying skin changes. Distal pulses intact.   Neurological: He is alert and oriented to person, place, and time. He has normal strength. No sensory deficit.  Skin: Skin is warm, dry and intact. No rash noted.  Psychiatric: He has a normal mood and affect.  Nursing note and vitals reviewed.    ED Treatments / Results  Labs (all labs ordered are listed, but only abnormal results are displayed) Labs Reviewed  COMPREHENSIVE METABOLIC PANEL - Abnormal; Notable for the following components:      Result Value   Total Protein 8.7 (*)    Total Bilirubin 1.3 (*)    All other components within normal limits  URINALYSIS, ROUTINE W REFLEX MICROSCOPIC - Abnormal; Notable for the following components:   Hgb urine dipstick SMALL (*)    Ketones, ur 5 (*)    Leukocytes, UA MODERATE (*)    All other components within normal limits  URINE CULTURE  LIPASE, BLOOD  CBC  CBC    EKG None  Radiology Dg Abd Acute W/chest  Result Date: 09/25/2017 CLINICAL DATA:  Upper back pain, nausea, vomiting. Patient reports alcohol intake last night. EXAM: DG ABDOMEN ACUTE W/ 1V  CHEST COMPARISON:  Chest radiograph June 06, 2017. FINDINGS: Cardiac silhouette is normal in size. Again noted is RIGHT aortic arch. Mediastinal silhouette is nonsuspicious. Lungs are clear, no pleural effusions. No pneumothorax. Soft tissue planes and included osseous structures are nonsuspicious. Prominent breast shadows. Scoliosis. Bowel gas pattern is nondilated and nonobstructive. No intra-abdominal mass effect, pathologic calcifications or free air. Phleboliths project in the pelvis. Soft tissue planes and included osseous structures are non-suspicious. IMPRESSION: Negative abdominal radiographs.  No acute cardiopulmonary disease. Electronically Signed   By: Awilda Metro M.D.   On: 09/25/2017 18:35    Procedures Procedures (including critical care time)  Medications Ordered in ED Medications    ondansetron (ZOFRAN-ODT) disintegrating tablet 8 mg (8 mg Oral Given 09/25/17 1830)  gi cocktail (Maalox,Lidocaine,Donnatal) (30 mLs Oral Given 09/25/17 1831)  famotidine (PEPCID) tablet 20 mg (20 mg Oral Given 09/25/17 1830)     Initial Impression / Assessment and Plan / ED Course  I have reviewed the triage vital signs and the nursing notes.  Pertinent labs & imaging results that were available during my care of the patient were reviewed by me and considered in my medical decision making (see chart for details).     32 y.o. male here with n/v and upper back pain that started this morning since drinking a lot of alcohol last night. Reports hematemesis. On exam, mild R mid-thoracic back TTP but no midline spinal TTP, ambulatory with steady gait, mild epigastric TTP, nonperitoneal. Overall well appearing. Work up thus far reveals: lipase WNL; CMP with marginally elevated Tbili 1.3 but otherwise unremarkable; CBC WNL. Will get U/A, repeat CBC since it's been 6hrs since he arrived and since his last hematemesis, and will get acute abd series to ensure no perforation or other concerning feature. Will give pepcid, GI cocktail, and zofran then reassess shortly.   7:45 PM Repeat CBC with stable H/H, which is highly reassuring that he did not have enough hematemesis to cause a clinically significant anemia. Acute abd series negative. U/A with small hgb, moderate leuks, 6-10 RBCs, 21-50 WBCs, but no bacteria seen and 0-5 squamous and +mucous present therefore this is likely a contaminated specimen; some of the findings could also be from mild dehydration from the vomiting. Doubt kidney stone or UTI given lack of symptoms that would correlate to that. Will send for UCx but doubt need for empiric tx. Pt feeling better and tolerating PO well here. Overall symptoms consistent with gastritis/GERD/PUD from his alcohol consumption, back pain could be musculoskeletal from either sleeping position or muscle strain  while vomiting. Discussed diet/lifestyle modifications for symptoms, will start on zantac/zofran, will also give flexeril rx, advised tylenol and avoidance/sparing use of NSAIDs only on full stomach, discussed other OTC remedies for symptomatic relief, heat use to his back, and f/up with PCP in 5-7 days for recheck of symptoms and ongoing evaluation/management. Alcohol cessation strongly advised. I explained the diagnosis and have given explicit precautions to return to the ER including for any other new or worsening symptoms. The patient understands and accepts the medical plan as it's been dictated and I have answered their questions. Discharge instructions concerning home care and prescriptions have been given. The patient is STABLE and is discharged to home in good condition.     Final Clinical Impressions(s) / ED Diagnoses   Final diagnoses:  Nausea and vomiting in adult patient  Acute right-sided thoracic back pain  Back muscle spasm  Acute alcoholic gastritis, presence of bleeding unspecified  Hematemesis  with nausea    ED Discharge Orders        Ordered    cyclobenzaprine (FLEXERIL) 10 MG tablet  3 times daily PRN     09/25/17 1944    ranitidine (ZANTAC) 150 MG tablet  2 times daily     09/25/17 1944    ondansetron (ZOFRAN ODT) 4 MG disintegrating tablet  Every 8 hours PRN     09/25/17 [address], [place], [place] 09/25/17 1946    Shaune Pollack, MD 09/26/17 1650

## 2017-09-25 NOTE — ED Triage Notes (Signed)
Patient here from home via EMS with complaints of nausea, vomiting that started last night. Reports drinking 2 bottles of liquor last night at a family gathering.

## 2017-09-25 NOTE — Discharge Instructions (Signed)
Your symptoms are likely due to your excessive alcohol use, which could have lead to gastritis or an ulcer. STOP DRINKING ALCOHOL! You will need to take zantac as directed, and avoid spicy/fatty/acidic foods, avoid soda/coffee/tea/alcohol. Avoid laying down flat within 30 minutes of eating. Avoid NSAIDs like ibuprofen/aleve/motrin/etc on an empty stomach. May consider using over the counter tums/maalox as needed for additional relief. Use zofran as directed as needed for nausea. Use tylenol as needed for pain. Use flexeril as directed as needed for muscle spasms but don't drive or operate machinery while taking that medication. Use heat to your back to help with pain, no more than 20 minutes per hour. Follow up with your regular doctor in 5-7 days for recheck of symptoms. Return to the ER for changes or worsening symptoms.  Abdominal (belly) pain can be caused by many things. Your caregiver performed an examination and possibly ordered blood/urine tests and imaging (CT scan, x-rays, ultrasound). Many cases can be observed and treated at home after initial evaluation in the emergency department. Even though you are being discharged home, abdominal pain can be unpredictable. Therefore, you need a repeated exam if your pain does not resolve, returns, or worsens. Most patients with abdominal pain don't have to be admitted to the hospital or have surgery, but serious problems like appendicitis and gallbladder attacks can start out as nonspecific pain. Many abdominal conditions cannot be diagnosed in one visit, so follow-up evaluations are very important. SEEK IMMEDIATE MEDICAL ATTENTION IF YOU DEVELOP ANY OF THE FOLLOWING SYMPTOMS: The pain does not go away or becomes severe.  A temperature above 101 develops.  Repeated vomiting occurs (multiple episodes).  The pain becomes localized to portions of the abdomen. The right side could possibly be appendicitis. In an adult, the left lower portion of the abdomen could  be colitis or diverticulitis.  Blood is being passed in stools or vomit (bright red or black tarry stools).  Return also if you develop chest pain, difficulty breathing, dizziness or fainting, or become confused, poorly responsive, or inconsolable (young children). The constipation stays for more than 4 days.  There is belly (abdominal) or rectal pain.  You do not seem to be getting better.

## 2017-09-27 LAB — URINE CULTURE

## 2017-12-11 ENCOUNTER — Encounter (HOSPITAL_COMMUNITY): Payer: Self-pay | Admitting: Emergency Medicine

## 2017-12-11 ENCOUNTER — Emergency Department (HOSPITAL_COMMUNITY)
Admission: EM | Admit: 2017-12-11 | Discharge: 2017-12-12 | Disposition: A | Discharge status: Home / Self Care | Payer: Self-pay | Attending: Emergency Medicine | Admitting: Emergency Medicine

## 2017-12-11 DIAGNOSIS — F4325 Adjustment disorder with mixed disturbance of emotions and conduct: Secondary | ICD-10-CM | POA: Diagnosis present

## 2017-12-11 DIAGNOSIS — F22 Delusional disorders: Secondary | ICD-10-CM | POA: Insufficient documentation

## 2017-12-11 DIAGNOSIS — Z79899 Other long term (current) drug therapy: Secondary | ICD-10-CM | POA: Insufficient documentation

## 2017-12-11 DIAGNOSIS — R45851 Suicidal ideations: Secondary | ICD-10-CM | POA: Insufficient documentation

## 2017-12-11 DIAGNOSIS — F17210 Nicotine dependence, cigarettes, uncomplicated: Secondary | ICD-10-CM | POA: Insufficient documentation

## 2017-12-11 LAB — COMPREHENSIVE METABOLIC PANEL
ALBUMIN: 4.5 g/dL (ref 3.5–5.0)
ALT: 32 U/L (ref 0–44)
AST: 50 U/L — AB (ref 15–41)
Alkaline Phosphatase: 73 U/L (ref 38–126)
Anion gap: 10 (ref 5–15)
CHLORIDE: 108 mmol/L (ref 98–111)
CO2: 25 mmol/L (ref 22–32)
CREATININE: 1.31 mg/dL — AB (ref 0.61–1.24)
Calcium: 9.7 mg/dL (ref 8.9–10.3)
GFR calc non Af Amer: >60 mL/min (ref >60)
GLUCOSE: 97 mg/dL (ref 70–99)
Potassium: 3.4 mmol/L — ABNORMAL LOW (ref 3.5–5.1)
SODIUM: 143 mmol/L (ref 135–145)
Total Bilirubin: 1.5 mg/dL — ABNORMAL HIGH (ref 0.3–1.2)
Total Protein: 7.2 g/dL (ref 6.5–8.1)

## 2017-12-11 LAB — RAPID URINE DRUG SCREEN, HOSP PERFORMED
AMPHETAMINES: NOT DETECTED
Benzodiazepines: NOT DETECTED
Cocaine: NOT DETECTED
OPIATES: NOT DETECTED
TETRAHYDROCANNABINOL: POSITIVE — AB

## 2017-12-11 LAB — CBC
HEMATOCRIT: 41.6 % (ref 39.0–52.0)
HEMOGLOBIN: 14 g/dL (ref 13.0–17.0)
MCH: 29.9 pg (ref 26.0–34.0)
MCHC: 33.7 g/dL (ref 30.0–36.0)
MCV: 88.7 fL (ref 78.0–100.0)
Platelets: 285 10*3/uL (ref 150–400)
RBC: 4.69 MIL/uL (ref 4.22–5.81)
RDW: 13.2 % (ref 11.5–15.5)
WBC: 7.2 10*3/uL (ref 4.0–10.5)

## 2017-12-11 LAB — ACETAMINOPHEN LEVEL: Acetaminophen (Tylenol), Serum: <10 ug/mL — ABNORMAL LOW (ref 10–30)

## 2017-12-11 LAB — ETHANOL: Alcohol, Ethyl (B): <10 mg/dL (ref <10)

## 2017-12-11 LAB — SALICYLATE LEVEL

## 2017-12-11 LAB — CBG MONITORING, ED: Glucose-Capillary: 100 mg/dL — ABNORMAL HIGH (ref 70–99)

## 2017-12-11 NOTE — ED Notes (Signed)
Pt given scrubs and socks, asked to remove clothing and belongings.

## 2017-12-11 NOTE — ED Triage Notes (Signed)
[facility] and told them he was suicidal.  Pt has plan to cut self, has a history of self harm.  Pt states he is having auditory hallucinations feels that it is because he has not had any sleep in 6 days.  Paranoid thinking that people are trying to break in to his house.  Pt reports only substance is marijuana.

## 2017-12-12 ENCOUNTER — Other Ambulatory Visit: Payer: Self-pay

## 2017-12-12 DIAGNOSIS — F4325 Adjustment disorder with mixed disturbance of emotions and conduct: Secondary | ICD-10-CM | POA: Diagnosis present

## 2017-12-12 MED ORDER — LORAZEPAM 1 MG PO TABS
1.0000 mg | ORAL_TABLET | Freq: Four times a day (QID) | ORAL | Status: DC | PRN
  Administered 2017-12-12 (×2): 1 mg via ORAL
  Filled 2017-12-12 (×2): qty 1

## 2017-12-12 NOTE — ED Notes (Signed)
Patient sleeping, Pt. Wasn't given a snack at this time.

## 2017-12-12 NOTE — ED Notes (Signed)
Regular diet ordered for dinner 

## 2017-12-12 NOTE — ED Notes (Addendum)
States is experiencing AH - "voices are talking shit". States they started yesterday am. States "I smoke weed and I drink too much for the human body". When asked to specify quantity of ETOH - states "1 bottle, 2 - 24 packs, I drink everybody's stuff". No ETOH withdrawals noted at this time. Pt voices understanding and agreement w/tx plan - Inpt. Encouraged pt to eat lunch - states he is not hungry at this time.

## 2017-12-12 NOTE — ED Notes (Addendum)
1820 - ALL belongings - 2 labeled belongings bags and 1 valuables envelope - returned to pt - Pt signed verifying all items present. Bus pass given.

## 2017-12-12 NOTE — [facility] Suicide Risk Assessment (Signed)
Suicide Risk Assessment  Discharge Assessment   [facility]   Principal Problem: Adjustment disorder with mixed disturbance of emotions and conduct Discharge Diagnoses:  Patient Active Problem List   Diagnosis Date Noted  . Adjustment disorder with mixed disturbance of emotions and conduct [F43.25] 12/12/2017    Priority: High    Total Time spent with patient: 45 minutes  Musculoskeletal: Strength & Muscle Tone: within normal limits Gait & Station: normal Patient leans: N/A  Psychiatric Specialty Exam:   Blood pressure 123/74, pulse 68, temperature 97.8 F (36.6 C), temperature source Oral, resp. rate 16, height 6\' 1"  (1.854 m), SpO2 99 %.Body mass index is 24.14 kg/m.  General Appearance: Casual  Eye Contact::  Good  Speech:  Normal Rate409  Volume:  Normal  Mood:  Euthymic  Affect:  Congruent  Thought Process:  Coherent and Descriptions of Associations: Intact  Orientation:  Full (Time, Place, and Person)  Thought Content:  WDL and Logical  Suicidal Thoughts:  No  Homicidal Thoughts:  No  Memory:  Immediate;   Good Recent;   Good Remote;   Good  Judgement:  Fair  Insight:  Fair  Psychomotor Activity:  Normal  Concentration:  Good  Recall:  Good  Fund of Knowledge:Good  Language: Good  Akathisia:  No  Handed:  Right  AIMS (if indicated):     Assets:  Housing Leisure Time Physical Health Resilience Social Support  Sleep:     Cognition: WNL  ADL's:  Intact   Mental Status Per Nursing Assessment::   On Admission:   32 yo male who presented to the ED after being up for six days and feeling paranoid after using cannabis, passive suicidal ideations.   He slept most of the time in the ED and this afternoon told this provider and TTS counselor, Stevan BornKimberly Dubois, that he was not suicidal or homicidal, no hallucinations, or withdrawal symptoms.  [facility].  Caveat:  Does not like  living with his sister but has a mother in town and friends to stay with.  STable for discharge.  Demographic Factors:  Male  Loss Factors: NA  Historical Factors: NA  Risk Reduction Factors:   Sense of responsibility to family, Living with another person, especially a relative and Positive social support  Continued Clinical Symptoms:  None  Cognitive Features That Contribute To Risk:  None    Suicide Risk:  Minimal: No identifiable suicidal ideation.  Patients presenting with no risk factors but with morbid ruminations; may be classified as minimal risk based on the severity of the depressive symptoms    Plan Of Care/Follow-up recommendations:  Activity:  as tolerated Diet:  heart healthy diet  LORD, Catha NottinghamJAMISON, NP 12/12/2017, 5:48 PM

## 2017-12-12 NOTE — ED Provider Notes (Signed)
[facility]ENC[facility] Provider Note   CSN: [id number] Arrival date & time: 12/11/17  2015     History   Chief Complaint Chief Complaint  Patient presents with  . Suicidal    HPI Steven Mooney is a 32 y.o. male.  Patient presents to the emergency department with a chief complaint of suicidal ideation.  He states that he is recently out of jail.  He has been staying with his sister and her boyfriend.  He states that they have been mumbling things about him, and he is concerned that they are plotting to kill him.  He is concerned that they are trying to poison his food.  He has not eaten the food that they have given him.  He states that he now plans to kill himself by jumping in front of a truck.  He denies any other associated symptoms.  He states that he takes medication for PTSD.  The history is provided by the patient. No language interpreter was used.    Past Medical History:  Diagnosis Date  . Gastric ulcer     There are no active problems to display for this patient.   History reviewed. No pertinent surgical history.      Home Medications    Prior to Admission medications   Medication Sig Start Date End Date Taking? Authorizing Provider  cyclobenzaprine (FLEXERIL) 10 MG tablet Take 1 tablet (10 mg total) by mouth 2 (two) times daily as needed for muscle spasms. Patient not taking: Reported on 06/11/2017 06/07/17   Kellie ShropshireShrosbree, Emily J, PA-C  cyclobenzaprine (FLEXERIL) 10 MG tablet Take 1 tablet (10 mg total) by mouth 3 (three) times daily as needed for muscle spasms. 09/25/17   Street, Mercedes, PA-C  guaiFENesin-codeine (ROBITUSSIN AC) 100-10 MG/5ML syrup Take 5 mLs by mouth 3 (three) times daily as needed for cough. Patient not taking: Reported on 06/11/2017 06/07/17   Kellie ShropshireShrosbree, Emily J, PA-C  hydrOXYzine (ATARAX/VISTARIL) 25 MG tablet Take 1 tablet (25 mg total) by mouth every 6 (six) hours as needed for anxiety. Patient not taking:  Reported on 06/07/2017 12/26/16   Jacalyn LefevreHaviland, Julie, MD  ondansetron (ZOFRAN ODT) 4 MG disintegrating tablet Take 1 tablet (4 mg total) by mouth every 8 (eight) hours as needed for nausea or vomiting. 09/25/17   Street, MayMercedes, PA-C  predniSONE (DELTASONE) 20 MG tablet Take 2 tablets (40 mg total) by mouth daily. Patient not taking: Reported on 06/11/2017 06/07/17   Kellie ShropshireShrosbree, Emily J, PA-C  ranitidine (ZANTAC) 150 MG tablet Take 1 tablet (150 mg total) by mouth 2 (two) times daily. 09/25/17   Street, Rexland AcresMercedes, PA-C    Family History No family history on file.  Social History Social History   Tobacco Use  . Smoking status: Current Every Day Smoker  . Smokeless tobacco: Never Used  Substance Use Topics  . Alcohol use: Yes  . Drug use: Yes    Types: Marijuana     Allergies   Penicillins   Review of Systems Review of Systems  All other systems reviewed and are negative.    Physical Exam Updated Vital Signs BP (!) 150/86 (BP Location: Right Arm)   Pulse (!) 109   Temp 98 F (36.7 C) (Oral)   Ht 6\' 1"  (1.854 m)   SpO2 99%   BMI 24.14 kg/m   Physical Exam  Constitutional: He is oriented to person, place, and time. He appears well-developed and well-nourished.  HENT:  Head: Normocephalic and atraumatic.  Eyes:  Pupils are equal, round, and reactive to light. Conjunctivae and EOM are normal. Right eye exhibits no discharge. Left eye exhibits no discharge. No scleral icterus.  Neck: Normal range of motion. Neck supple. No JVD present.  Cardiovascular: Normal rate, regular rhythm and normal heart sounds. Exam reveals no gallop and no friction rub.  No murmur heard. Pulmonary/Chest: Effort normal and breath sounds normal. No respiratory distress. He has no wheezes. He has no rales. He exhibits no tenderness.  Abdominal: Soft. He exhibits no distension and no mass. There is no tenderness. There is no rebound and no guarding.  Musculoskeletal: Normal range of motion. He exhibits no  edema or tenderness.  Neurological: He is alert and oriented to person, place, and time.  Skin: Skin is warm and dry.  Psychiatric: He has a normal mood and affect. His behavior is normal. Judgment and thought content normal.  Nursing note and vitals reviewed.    ED Treatments / Results  Labs (all labs ordered are listed, but only abnormal results are displayed) Labs Reviewed  COMPREHENSIVE METABOLIC PANEL - Abnormal; Notable for the following components:      Result Value   Potassium 3.4 (*)    BUN <5 (*)    Creatinine, Ser 1.31 (*)    AST 50 (*)    Total Bilirubin 1.5 (*)    All other components within normal limits  ACETAMINOPHEN LEVEL - Abnormal; Notable for the following components:   Acetaminophen (Tylenol), Serum <10 (*)    All other components within normal limits  RAPID URINE DRUG SCREEN, HOSP PERFORMED - Abnormal; Notable for the following components:   Tetrahydrocannabinol POSITIVE (*)    Barbiturates   (*)    Value: Result not available. Reagent lot number recalled by manufacturer.   All other components within normal limits  CBG MONITORING, ED - Abnormal; Notable for the following components:   Glucose-Capillary 100 (*)    All other components within normal limits  ETHANOL  SALICYLATE LEVEL  CBC    EKG None  Radiology No results found.  Procedures Procedures (including critical care time)  Medications Ordered in ED Medications - No data to display   Initial Impression / Assessment and Plan / ED Course  I have reviewed the triage vital signs and the nursing notes.  Pertinent labs & imaging results that were available during my care of the patient were reviewed by me and considered in my medical decision making (see chart for details).    Patient here for paranoid delusions.  States that he feels like his family is trying to kill him.  He is medically clear.  TTS evaluation pending.  Patient meets inpatient criteria.  Final Clinical Impressions(s)  / ED Diagnoses   Final diagnoses:  Suicidal ideation  Paranoia [facility])    ED Discharge Orders    None       Roxy Horseman, PA-C 12/12/17 1610    Shon Baton, MD 12/12/17 [phone]
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
## 2017-12-12 NOTE — ED Notes (Signed)
Regular Diet was ordered for lunch. 

## 2017-12-12 NOTE — Progress Notes (Signed)
Patient seen at 4 AM this morning so telepsych will not be done until tomorrow morning.  CSW informed TTS Counselor Delvondria D, LPC of status.  Timmothy EulerJean T. Kaylyn LimSutter, MSW, LCSWA Disposition Clinical Social Work [phone] (cell) [phone] (office)

## 2017-12-12 NOTE — ED Notes (Signed)
Ativan given as requested. Pt declined to eat lunch. Sprite given as requested.

## 2017-12-12 NOTE — ED Notes (Signed)
Breakfast tray ordered 

## 2017-12-12 NOTE — Progress Notes (Addendum)
32 yo male who presented to the ED after being up for six days and feeling paranoid after using cannabis, passive suicidal ideations.   He slept most of the time in the ED and this afternoon told this provider and TTS counselor, Stevan BornKimberly Dubois, that he was not suicidal or homicidal, no hallucinations, or withdrawal symptoms.  [facility] after discharge.  Caveat:  Does not like living with his sister but has a mother in town and friends to stay with. It seems he needed a break from her and recovered well from some sleep and food.  He reported drinking alcohol daily, large amount but negative on UDS and no signs of withdrawal. STable for discharge.  Dr Lucianne MussKumar reviewed this patient and concurs with the plan.  Nanine MeansJamison Dorlene Footman, PMHNP

## 2017-12-12 NOTE — Discharge Instructions (Signed)
Adjustment Disorder, Adult Adjustment disorder is a group of symptoms that can develop after a stressful life event, such as the loss of a job or serious physical illness. The symptoms can affect how you feel, think, and act. They may interfere with your relationships. Adjustment disorder increases your risk of suicide and substance abuse. If this disorder is not managed early, it can develop into a more serious condition, such as major depressive disorder or post-traumatic stress disorder. What are the causes? This condition happens when you have trouble recovering from or coping with a stressful life event. What increases the risk? You are more likely to develop this condition if:  You have had depression or anxiety.  You are being treated for a long-term (chronic) illness.  You are being treated for an illness that cannot be cured (terminal illness).  You have a family history of mental illness.  What are the signs or symptoms? Symptoms of this condition include:  Extreme trouble doing daily tasks, such as going to work.  Sadness, depression, or crying spells.  Worrying a lot.  Loss of enjoyment.  Change in appetite or weight.  Feelings of loss or hopelessness.  Thoughts of suicide.  Anxiety, worry, or nervousness.  Trouble sleeping.  Avoiding family and friends.  Fighting or vandalism.  Complaining of feeling sick without being ill.  Feeling dazed or disconnected.  Nightmares.  Trouble sleeping.  Irritability.  Reckless driving.  Poor work performance.  Ignoring bills.  Symptoms of this condition start within three months of the stressful event. They do not last more than six months, unless the stressful circumstances last longer. Normal grieving after the death of a loved one is not a symptom of this condition. How is this diagnosed? To diagnose this condition, your health care provider will ask about what has happened in your life and how it has  affected you. He or she may also ask about your medical history and your use of medicines, alcohol, and other substances. Your health care provider may do a physical exam and order lab tests or other studies. You may be referred to a mental health specialist. How is this treated? Treatment options for this condition include:  Counseling or talk therapy. Talk therapy is usually provided by mental health specialists.  Medicines. Certain medicines may help with depression, anxiety, and sleep.  Support groups. These offer emotional support, advice, and guidance. They are made up of people who have had similar experiences.  Observation and time. This is sometimes called "watchful waiting." In this treatment, health care providers monitor your health and behavior without other treatment. Adjustment disorder sometimes gets better on its own with time.  Follow these instructions at home:  Take over-the-counter and prescription medicines only as told by your health care provider.  Keep all follow-up visits as told by your health care provider. This is important. Contact a health care provider if:  Your symptoms do not improve in six months.  Your symptoms get worse. Get help right away if:  You have serious thoughts about hurting yourself or someone else. If you ever feel like you may hurt yourself or others, or have thoughts about taking your own life, get help right away. You can go to your nearest emergency department or call:  Your local emergency services (911 in the [place]  A suicide crisis helpline, such as the National Suicide Prevention Lifeline at 1-800-273-8255. This is open 24 hours a day.  Summary  Adjustment disorder is a group of   symptoms that can develop after a stressful life event, such as the loss of a job or serious physical illness. The symptoms can affect how you feel, think, and act. They may interfere with your relationships.  Symptoms of this condition start within  three months of the stressful event. They do not last more than six months, unless the stressful circumstances last longer.  Treatment may include talk therapy, medicines, participation in a support group, or observation to see if symptoms improve.  Contact your health care provider if your symptoms get worse or do not improve in six months.  If you ever feel like you may hurt yourself or others, or have thoughts about taking your own life, get help right away. This information is not intended to replace advice given to you by your health care provider. Make sure you discuss any questions you have with your health care provider. Document Released: 01/18/2006 Document Revised: 07/15/2016 Document Reviewed: 07/15/2016 Elsevier Interactive Patient Education  2018 Elsevier Inc.  

## 2017-12-12 NOTE — BHH Counselor (Signed)
[name], RN, informed Psychiatric nurseTTS writer patient is sleeping. TTS will be notified once patient awakes so the re-assessment can be conducted.
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
## 2017-12-12 NOTE — ED Notes (Signed)
Denies SI/HI - states he wants to be d/c'd today rather than waiting for tomorrow.

## 2017-12-21 ENCOUNTER — Other Ambulatory Visit: Payer: Self-pay

## 2017-12-21 ENCOUNTER — Encounter (HOSPITAL_COMMUNITY): Payer: Self-pay

## 2017-12-21 ENCOUNTER — Emergency Department (HOSPITAL_COMMUNITY)
Admission: EM | Admit: 2017-12-21 | Discharge: 2017-12-21 | Disposition: A | Discharge status: Home / Self Care | Payer: Self-pay | Attending: Emergency Medicine | Admitting: Emergency Medicine

## 2017-12-21 ENCOUNTER — Emergency Department (HOSPITAL_COMMUNITY): Payer: Self-pay

## 2017-12-21 DIAGNOSIS — F1290 Cannabis use, unspecified, uncomplicated: Secondary | ICD-10-CM | POA: Insufficient documentation

## 2017-12-21 DIAGNOSIS — M545 Low back pain, unspecified: Secondary | ICD-10-CM

## 2017-12-21 DIAGNOSIS — F17210 Nicotine dependence, cigarettes, uncomplicated: Secondary | ICD-10-CM | POA: Insufficient documentation

## 2017-12-21 MED ORDER — CYCLOBENZAPRINE HCL 10 MG PO TABS: 10.0000 mg | ORAL_TABLET | Freq: Three times a day (TID) | ORAL | 0 refills | Status: DC | PRN

## 2017-12-21 MED ORDER — KETOROLAC TROMETHAMINE 30 MG/ML IJ SOLN
30.0000 mg | Freq: Once | INTRAMUSCULAR | Status: AC
  Administered 2017-12-21: 30 mg via INTRAVENOUS
  Filled 2017-12-21: qty 1

## 2017-12-21 MED ORDER — IBUPROFEN 800 MG PO TABS: 800.0000 mg | ORAL_TABLET | Freq: Three times a day (TID) | ORAL | 0 refills | Status: DC | PRN

## 2017-12-21 MED ORDER — OXYCODONE-ACETAMINOPHEN 5-325 MG PO TABS
2.0000 | ORAL_TABLET | Freq: Once | ORAL | Status: AC
  Administered 2017-12-21: 2 via ORAL
  Filled 2017-12-21: qty 2

## 2017-12-21 MED ORDER — OXYCODONE-ACETAMINOPHEN 5-325 MG PO TABS: 2.0000 | ORAL_TABLET | ORAL | 0 refills | Status: DC | PRN

## 2017-12-21 NOTE — ED Notes (Signed)
Patient verbalized understanding of discharge instructions, no questions. Patient out of ED via wheelchair to wait for taxi. Patient uncomfortable.

## 2017-12-21 NOTE — ED Notes (Signed)
Bed: [id number] Expected date: 12/21/17 Expected time: 8:21 AM Means of arrival: Ambulance Comments: Back Pain

## 2017-12-21 NOTE — ED Provider Notes (Signed)
[facility]-EMERGENCY DEPT Provider Note   CSN: [id number] Arrival date & time: 12/21/17  [id number]     History   Chief Complaint Chief Complaint  Patient presents with  . Back Pain    HPI Steven Mooney is a 32 y.o. male.  He presents to the emergency department via ambulance with 2 days of low back pain.  States the pain is only in his back that started mild but has been more progressive.  Today he states the pain was so much that he was unable to walk any called the ambulance.  Does not associate with any urinary symptoms no incontinence of bowel or bladder.  There is no numbness or weakness.  States pain is limiting him on ambulating.  Is been no fever no cough no chest pain.  Denies any trauma.  Of note he was here on the 15th after being released from jail complaining of suicidal ideation.  Denies any IV drug use.  He received fentanyl in the ambulance with mild relief.  The history is provided by the patient.  Back Pain   This is a new problem. The current episode started 2 days ago. The problem occurs constantly. The problem has been gradually worsening. The pain is associated with no known injury. The pain is present in the lumbar spine. The quality of the pain is described as stabbing and shooting. The pain is at a severity of 8/10. The symptoms are aggravated by bending and twisting. The pain is the same all the time. Pertinent negatives include no chest pain, no fever, no numbness, no abdominal pain, no bowel incontinence, no perianal numbness, no bladder incontinence, no dysuria, no leg pain, no paresthesias, no paresis, no tingling and no weakness. He has tried nothing for the symptoms. The treatment provided no relief.    Past Medical History:  Diagnosis Date  . Gastric ulcer     Patient Active Problem List   Diagnosis Date Noted  . 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 No pertinent surgical  history.      Home Medications    Prior to Admission medications   Medication Sig Start Date End Date Taking? Authorizing Provider  cyclobenzaprine (FLEXERIL) 10 MG tablet Take 1 tablet (10 mg total) by mouth 3 (three) times daily as needed for muscle spasms. 12/21/17   Terrilee FilesButler, Breyden C, MD  ibuprofen (ADVIL,MOTRIN) 800 MG tablet Take 1 tablet (800 mg total) by mouth every 8 (eight) hours as needed. 12/21/17   Terrilee FilesButler, Emric C, MD  oxyCODONE-acetaminophen (PERCOCET/ROXICET) 5-325 MG tablet Take 2 tablets by mouth every 4 (four) hours as needed for severe pain. 12/21/17   Terrilee FilesButler, Clem C, MD    Family History No family history on file.  Social History Social History   Tobacco Use  . Smoking status: Current Every Day Smoker  . Smokeless tobacco: Never Used  Substance Use Topics  . Alcohol use: Yes  . Drug use: Yes    Types: Marijuana     Allergies   Penicillins   Review of Systems Review of Systems  Constitutional: Negative for fever.  HENT: Negative for sore throat.   Eyes: Negative for visual disturbance.  Respiratory: Negative for shortness of breath.   Cardiovascular: Negative for chest pain.  Gastrointestinal: Negative for abdominal pain and bowel incontinence.  Genitourinary: Negative for bladder incontinence and dysuria.  Musculoskeletal: Positive for back pain. Negative for neck pain.  Skin: Negative for rash.  Neurological: Negative for tingling, weakness, numbness and paresthesias.     Physical Exam Updated Vital Signs Pulse 90   Temp 98.3 F (36.8 C) (Oral)   Resp 15   Ht 6' (1.829 m)   Wt 90.7 kg (200 lb)   SpO2 95%   BMI 27.12 kg/m   Physical Exam  Constitutional: He appears well-developed and well-nourished.  HENT:  Head: Normocephalic and atraumatic.  Eyes: Conjunctivae are normal.  Neck: Neck supple.  Cardiovascular: Normal rate, regular rhythm, normal heart sounds and intact distal pulses.  Pulmonary/Chest: Effort normal and breath  sounds normal. He has no wheezes. He has no rales.  Abdominal: Soft. He exhibits no mass. There is no tenderness. There is no guarding.  Musculoskeletal: Normal range of motion. He exhibits no deformity.  He has diffuse tenderness of his lower back both midline and paralumbar.  There are no overlying skin findings.  He is intact sensation in his lower extremities and strength is preserved although limited by pain.  Neurological: He is alert. GCS eye subscore is 4. GCS verbal subscore is 5. GCS motor subscore is 6.  Skin: Skin is warm and dry.  Psychiatric: He has a normal mood and affect.  Nursing note and vitals reviewed.    ED Treatments / Results  Labs (all labs ordered are listed, but only abnormal results are displayed) Labs Reviewed - No data to display  EKG None  Radiology Dg Lumbar Spine Complete  Result Date: 12/21/2017 CLINICAL DATA:  Low back pain for 2 days. EXAM: LUMBAR SPINE - COMPLETE 4+ VIEW COMPARISON:  Abdominal radiographs 09/25/2017 FINDINGS: There are 5 non rib-bearing lumbar type vertebrae. Thoracolumbar dextroscoliosis is again seen. There is no listhesis. No fracture is identified. Intervertebral disc space heights are preserved without significant degenerative changes identified. There is mildly prominent gas in small and large bowel without evidence of obstruction. IMPRESSION: Thoracolumbar scoliosis without acute osseous abnormality. Electronically Signed   By: Sebastian Ache M.D.   On: 12/21/2017 09:19    Procedures Procedures (including critical care time)  Medications Ordered in ED Medications  ketorolac (TORADOL) 30 MG/ML injection 30 mg (has no administration in time range)  oxyCODONE-acetaminophen (PERCOCET/ROXICET) 5-325 MG per tablet 2 tablet (has no administration in time range)     Initial Impression / Assessment and Plan / ED Course  I have reviewed the triage vital signs and the nursing notes.  Pertinent labs & imaging results that were  available during my care of the patient were reviewed by me and considered in my medical decision making (see chart for details).  Clinical Course as of Dec 21 1748  Thu Dec 21, 2017  1007 Patient's imaging showed some signs of scoliosis.  He is improved with pain medicine and able to ambulate.  Feel he can be discharged with some pain medicine and we are giving him the number for [facility].   [MB]    Clinical Course User Index [MB] Terrilee Files, MD     Final Clinical Impressions(s) / ED Diagnoses   Final diagnoses:  Acute bilateral low back pain without sciatica    ED Discharge Orders    None       Terrilee Files, MD 12/21/17 1750

## 2017-12-21 NOTE — Discharge Instructions (Signed)
Your evaluated in the emergency department for low back pain and inability to walk.  Your x-ray did not show any acute fractures but you do have some mild scoliosis.  He was given some pain medicine with improvement in your symptoms.  You will be important for you to establish a primary care doctor and we have given you a number for the clinic.  We are giving you a short course of pain medication.

## 2017-12-21 NOTE — ED Triage Notes (Signed)
Patient arrives with c/o of lower back pain that started 2 days ago. Patient denies any trauma. EMS gave fentany, NS. No hx back problems. Denies any loss of bowel or bladder control. States numbness/tingling to bilateral knees.   BP: 147/88 P: 80 O2: 100%

## 2018-01-11 ENCOUNTER — Encounter (HOSPITAL_COMMUNITY): Payer: Self-pay

## 2018-01-11 ENCOUNTER — Emergency Department (HOSPITAL_COMMUNITY)
Admission: EM | Admit: 2018-01-11 | Discharge: 2018-01-12 | Disposition: A | Discharge status: Home / Self Care | Payer: Self-pay | Attending: Emergency Medicine | Admitting: Emergency Medicine

## 2018-01-11 DIAGNOSIS — Y999 Unspecified external cause status: Secondary | ICD-10-CM | POA: Insufficient documentation

## 2018-01-11 DIAGNOSIS — T63481A Toxic effect of venom of other arthropod, accidental (unintentional), initial encounter: Secondary | ICD-10-CM

## 2018-01-11 DIAGNOSIS — Y929 Unspecified place or not applicable: Secondary | ICD-10-CM | POA: Insufficient documentation

## 2018-01-11 DIAGNOSIS — W57XXXA Bitten or stung by nonvenomous insect and other nonvenomous arthropods, initial encounter: Secondary | ICD-10-CM | POA: Insufficient documentation

## 2018-01-11 DIAGNOSIS — F17210 Nicotine dependence, cigarettes, uncomplicated: Secondary | ICD-10-CM | POA: Insufficient documentation

## 2018-01-11 DIAGNOSIS — Y939 Activity, unspecified: Secondary | ICD-10-CM | POA: Insufficient documentation

## 2018-01-11 DIAGNOSIS — S50861A Insect bite (nonvenomous) of right forearm, initial encounter: Secondary | ICD-10-CM | POA: Insufficient documentation

## 2018-01-11 MED ORDER — HYDROCORTISONE 1 % EX CREA: 1.0000 "application " | TOPICAL_CREAM | Freq: Two times a day (BID) | CUTANEOUS | 1 refills | Status: DC

## 2018-01-11 MED ORDER — IBUPROFEN 800 MG PO TABS
800.0000 mg | ORAL_TABLET | Freq: Once | ORAL | Status: AC
  Administered 2018-01-11: 800 mg via ORAL
  Filled 2018-01-11: qty 1

## 2018-01-11 MED ORDER — IBUPROFEN 800 MG PO TABS: 800.0000 mg | ORAL_TABLET | Freq: Three times a day (TID) | ORAL | 0 refills | Status: DC | PRN

## 2018-01-11 NOTE — Discharge Instructions (Signed)
We recommend ibuprofen for pain or inflammation.  Apply hydrocortisone cream as prescribed to the affected area.  You may take Benadryl or over-the-counter Zyrtec or Claritin for itching.  Take as instructed on the box.
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
## 2018-01-12 NOTE — ED Provider Notes (Signed)
[facility]ENC[facility] Provider Note   CSN: [id number] Arrival date & time: 01/11/18  2257     History   Chief Complaint Chief Complaint  Patient presents with  . Allergic Reaction    HPI Monica BectonMichael Bonifield is a 32 y.o. male.  32 year old male presents to the emergency department for complaints of itching to areas of bug bites.  These were sustained approximately 24 hours ago.  He is unsure of what bit him.  He has some soreness associated with the bug bites at his wrist.  Denies taking any medications for symptoms prior to arrival.  Was given 50 mg of Benadryl by EMS in transport for itching.  This has helped some.  Denies any fevers, purulent drainage.  No numbness or weakness.     Past Medical History:  Diagnosis Date  . Gastric ulcer     Patient Active Problem List   Diagnosis Date Noted  . 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 No pertinent surgical history.      Home Medications    Prior to Admission medications   Medication Sig Start Date End Date Taking? Authorizing Provider  cyclobenzaprine (FLEXERIL) 10 MG tablet Take 1 tablet (10 mg total) by mouth 3 (three) times daily as needed for muscle spasms. 12/21/17   Terrilee FilesButler, Wetzel C, MD  hydrocortisone cream 1 % Apply 1 application topically 2 (two) times daily. Do not apply to face 01/11/18   Antony MaduraHumes, Macy Lingenfelter, PA-C  ibuprofen (ADVIL,MOTRIN) 800 MG tablet Take 1 tablet (800 mg total) by mouth every 8 (eight) hours as needed. 01/11/18   Antony MaduraHumes, Quianna Avery, PA-C  oxyCODONE-acetaminophen (PERCOCET/ROXICET) 5-325 MG tablet Take 2 tablets by mouth every 4 (four) hours as needed for severe pain. 12/21/17   Terrilee FilesButler, Garret C, MD    Family History History reviewed. No pertinent family history.  Social History Social History   Tobacco Use  . Smoking status: Current Every Day Smoker  . Smokeless tobacco: Never Used  Substance Use Topics  .  Alcohol use: Yes  . Drug use: Yes    Types: Marijuana     Allergies   Penicillins   Review of Systems Review of Systems Ten systems reviewed and are negative for acute change, except as noted in the HPI.    Physical Exam Updated Vital Signs BP (!) 130/94 (BP Location: Left Arm)   Pulse 68   Temp 98.4 F (36.9 C) (Oral)   Resp (!) 22   Ht 6' (1.829 m)   Wt 104.3 kg   SpO2 97%   BMI 31.19 kg/m   Physical Exam  Constitutional: He is oriented to person, place, and time. He appears well-developed and well-nourished. No distress.  Nontoxic appearing and in NAD  HENT:  Head: Normocephalic and atraumatic.  Eyes: Conjunctivae and EOM are normal. No scleral icterus.  Neck: Normal range of motion.  Pulmonary/Chest: Effort normal. No respiratory distress.  Respirations even and unlabored  Musculoskeletal: Normal range of motion.  Neurological: He is alert and oriented to person, place, and time. He exhibits normal muscle tone. Coordination normal.  Skin: Skin is warm and dry. He is not diaphoretic. No erythema. No pallor.  Scattered bug bites to the volar aspect of the right forearm extending to the wrist.  Mild associated erythema without heat or induration.  No purulent drainage.  No lymphangitic streaking.  Psychiatric: He has a normal mood and affect. His behavior is normal.  Nursing  note and vitals reviewed.    ED Treatments / Results  Labs (all labs ordered are listed, but only abnormal results are displayed) Labs Reviewed - No data to display  EKG None  Radiology No results found.  Procedures Procedures (including critical care time)  Medications Ordered in ED Medications  ibuprofen (ADVIL,MOTRIN) tablet 800 mg (800 mg Oral Given 01/11/18 2357)     Initial Impression / Assessment and Plan / ED Course  I have reviewed the triage vital signs and the nursing notes.  Pertinent labs & imaging results that were available during my care of the patient were  reviewed by me and considered in my medical decision making (see chart for details).     32 year old male with bug bites to his right forearm.  There is evidence of localized reaction without secondary infection or cellulitis.  Patient neurovascularly intact.  Will manage supportively with NSAIDs and topical hydrocortisone.  Return precautions discussed and provided. Patient discharged in stable condition with no unaddressed concerns.   Final Clinical Impressions(s) / ED Diagnoses   Final diagnoses:  Local reaction to insect sting, accidental or unintentional, initial encounter    ED Discharge Orders         Ordered    hydrocortisone cream 1 %  2 times daily     01/11/18 2358    ibuprofen (ADVIL,MOTRIN) 800 MG tablet  Every 8 hours PRN     01/11/18 2358           Antony MaduraHumes, Wilena Tyndall, PA-C 01/12/18 0039    Melene PlanFloyd, Dan, DO 01/12/18 [id number]

## 2018-01-12 NOTE — ED Notes (Signed)
Pt in hallway unable to sign for discharge 

## 2018-03-09 ENCOUNTER — Emergency Department (HOSPITAL_COMMUNITY)
Admission: EM | Admit: 2018-03-09 | Discharge: 2018-03-10 | Disposition: A | Discharge status: Home / Self Care | Payer: Self-pay | Attending: Emergency Medicine | Admitting: Emergency Medicine

## 2018-03-09 ENCOUNTER — Other Ambulatory Visit: Payer: Self-pay

## 2018-03-09 ENCOUNTER — Encounter (HOSPITAL_COMMUNITY): Payer: Self-pay | Admitting: Emergency Medicine

## 2018-03-09 DIAGNOSIS — M7918 Myalgia, other site: Secondary | ICD-10-CM | POA: Insufficient documentation

## 2018-03-09 DIAGNOSIS — F1210 Cannabis abuse, uncomplicated: Secondary | ICD-10-CM | POA: Insufficient documentation

## 2018-03-09 DIAGNOSIS — R0981 Nasal congestion: Secondary | ICD-10-CM | POA: Insufficient documentation

## 2018-03-09 DIAGNOSIS — F17200 Nicotine dependence, unspecified, uncomplicated: Secondary | ICD-10-CM | POA: Insufficient documentation

## 2018-03-09 DIAGNOSIS — R070 Pain in throat: Secondary | ICD-10-CM | POA: Insufficient documentation

## 2018-03-09 DIAGNOSIS — R5381 Other malaise: Secondary | ICD-10-CM | POA: Insufficient documentation

## 2018-03-09 DIAGNOSIS — J209 Acute bronchitis, unspecified: Secondary | ICD-10-CM | POA: Insufficient documentation

## 2018-03-09 HISTORY — DX: Scoliosis, unspecified: M41.9

## 2018-03-09 NOTE — ED Triage Notes (Signed)
C/o productive cough with green/yellow phlegm x 2 weeks.  States he initially had sore throat that has resolved but continues to have difficulty swallowing.  Reports generalized body aches/neck aching but denies pain at present.

## 2018-03-09 NOTE — ED Notes (Signed)
No answer in WR

## 2018-03-09 NOTE — ED Triage Notes (Signed)
Pt transported via [facility] from home c/o productive cough general malaise x 2 weeks unrelieved by OTC meds. Pt took Benadryl 50mg  and 1 cup Nyquil.

## 2018-03-10 MED ORDER — IBUPROFEN 800 MG PO TABS
800.0000 mg | ORAL_TABLET | Freq: Once | ORAL | Status: AC
  Administered 2018-03-10: 800 mg via ORAL
  Filled 2018-03-10: qty 1

## 2018-03-10 MED ORDER — ALBUTEROL SULFATE HFA 108 (90 BASE) MCG/ACT IN AERS
2.0000 | INHALATION_SPRAY | RESPIRATORY_TRACT | Status: DC | PRN
  Administered 2018-03-10: 2 via RESPIRATORY_TRACT
  Filled 2018-03-10: qty 6.7

## 2018-03-10 MED ORDER — AZITHROMYCIN 250 MG PO TABS: 250.0000 mg | ORAL_TABLET | Freq: Every day | ORAL | 0 refills | Status: DC

## 2018-03-10 NOTE — ED Notes (Signed)
No answer X 3.

## 2018-03-10 NOTE — ED Notes (Signed)
Steven Mooney came to the desk and asked if his name had been called.  RN explained that he had been called several times, pt stated he was in the bathroom.  RN told him that we would work on getting him a room. RN spoke to Charge who directed RN to place in room.  RN looked for pt and could not find him.  Security stated he went up the elevator.  RN attempted to find pt even going outside to call for him.  RN will place pt in room as soon as he comes back.

## 2018-03-10 NOTE — ED Provider Notes (Signed)
[facility] EMERGENCY DEPARTMENT Provider Note   CSN: [id number] Arrival date & time: 03/09/18  2313     History   Chief Complaint Chief Complaint  Patient presents with  . URI    HPI Steven Mooney is a 32 y.o. male.  Patient presents to the emergency department for evaluation of cough and chest congestion.  Patient reports that symptoms have been ongoing for 2 weeks.  It started with a sore throat followed by generalized malaise and body aches.  He has now developed a deep cough productive of yellow sputum. He complains of a dull headache.     Past Medical History:  Diagnosis Date  . Gastric ulcer   . Scoliosis     Patient Active Problem List   Diagnosis Date Noted  . 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 No pertinent surgical history.      Home Medications    Prior to Admission medications   Medication Sig Start Date End Date Taking? Authorizing Provider  azithromycin (ZITHROMAX) 250 MG tablet Take 1 tablet (250 mg total) by mouth daily. Take first 2 tablets together, then 1 every day until finished. 03/10/18   Gilda Crease, MD  cyclobenzaprine (FLEXERIL) 10 MG tablet Take 1 tablet (10 mg total) by mouth 3 (three) times daily as needed for muscle spasms. 12/21/17   Terrilee Files, MD  hydrocortisone cream 1 % Apply 1 application topically 2 (two) times daily. Do not apply to face 01/11/18   Antony Madura, PA-C  ibuprofen (ADVIL,MOTRIN) 800 MG tablet Take 1 tablet (800 mg total) by mouth every 8 (eight) hours as needed. 01/11/18   Antony Madura, PA-C  oxyCODONE-acetaminophen (PERCOCET/ROXICET) 5-325 MG tablet Take 2 tablets by mouth every 4 (four) hours as needed for severe pain. 12/21/17   Terrilee Files, MD    Family History No family history on file.  Social History Social History   Tobacco Use  . Smoking status: Current Every Day Smoker  . Smokeless tobacco:  Never Used  Substance Use Topics  . Alcohol use: Yes  . Drug use: Yes    Types: Marijuana     Allergies   Penicillins   Review of Systems Review of Systems  Constitutional: Negative for fever.  HENT: Positive for congestion.   Respiratory: Positive for cough.   Musculoskeletal: Positive for myalgias.  Neurological: Positive for headaches.  All other systems reviewed and are negative.    Physical Exam Updated Vital Signs BP (!) 131/93 (BP Location: Right Arm)   Pulse 86   Temp 98.9 F (37.2 C) (Oral)   Resp 20   SpO2 100%   Physical Exam  Constitutional: He is oriented to person, place, and time. He appears well-developed and well-nourished. No distress.  HENT:  Head: Normocephalic and atraumatic.  Right Ear: Hearing normal.  Left Ear: Hearing normal.  Nose: Nose normal.  Mouth/Throat: Oropharynx is clear and moist and mucous membranes are normal.  Eyes: Pupils are equal, round, and reactive to light. Conjunctivae and EOM are normal.  Neck: Normal range of motion. Neck supple. No Brudzinski's sign and no Kernig's sign noted.  Cardiovascular: Regular rhythm, S1 normal and S2 normal. Exam reveals no gallop and no friction rub.  No murmur heard. Pulmonary/Chest: Effort normal. No respiratory distress. He has rhonchi (bilat bases). He exhibits no tenderness.  Abdominal: Soft. Normal appearance and bowel sounds are normal. There is no hepatosplenomegaly. There is no tenderness.  There is no rebound, no guarding, no tenderness at McBurney's point and negative Murphy's sign. No hernia.  Musculoskeletal: Normal range of motion.  Neurological: He is alert and oriented to person, place, and time. He has normal strength. No cranial nerve deficit or sensory deficit. Coordination normal. GCS eye subscore is 4. GCS verbal subscore is 5. GCS motor subscore is 6.  Skin: Skin is warm, dry and intact. No rash noted. No cyanosis.  Psychiatric: He has a normal mood and affect. His speech  is normal and behavior is normal. Thought content normal.  Nursing note and vitals reviewed.    ED Treatments / Results  Labs (all labs ordered are listed, but only abnormal results are displayed) Labs Reviewed - No data to display  EKG None  Radiology No results found.  Procedures Procedures (including critical care time)  Medications Ordered in ED Medications  ibuprofen (ADVIL,MOTRIN) tablet 800 mg (has no administration in time range)  albuterol (PROVENTIL HFA;VENTOLIN HFA) 108 (90 Base) MCG/ACT inhaler 2 puff (has no administration in time range)     Initial Impression / Assessment and Plan / ED Course  I have reviewed the triage vital signs and the nursing notes.  Pertinent labs & imaging results that were available during my care of the patient were reviewed by me and considered in my medical decision making (see chart for details).     Persistent cough for 2 weeks.  Oxygenation normal, afebrile.  Scattered rhonchi at bases, empirically cover with high-dose zithromax, albuterol.  Final Clinical Impressions(s) / ED Diagnoses   Final diagnoses:  Acute bronchitis, unspecified organism    ED Discharge Orders         Ordered    azithromycin (ZITHROMAX) 250 MG tablet  Daily     03/10/18 0254           Gilda Crease, MD 03/10/18 [phone]

## 2018-04-14 ENCOUNTER — Encounter (HOSPITAL_COMMUNITY): Payer: Self-pay | Admitting: Emergency Medicine

## 2018-04-14 ENCOUNTER — Emergency Department (HOSPITAL_COMMUNITY)
Admission: EM | Admit: 2018-04-14 | Discharge: 2018-04-14 | Disposition: A | Discharge status: Home / Self Care | Payer: Self-pay | Attending: Emergency Medicine | Admitting: Emergency Medicine

## 2018-04-14 DIAGNOSIS — K644 Residual hemorrhoidal skin tags: Secondary | ICD-10-CM | POA: Insufficient documentation

## 2018-04-14 DIAGNOSIS — K5909 Other constipation: Secondary | ICD-10-CM | POA: Insufficient documentation

## 2018-04-14 DIAGNOSIS — F17200 Nicotine dependence, unspecified, uncomplicated: Secondary | ICD-10-CM | POA: Insufficient documentation

## 2018-04-14 DIAGNOSIS — Z79899 Other long term (current) drug therapy: Secondary | ICD-10-CM | POA: Insufficient documentation

## 2018-04-14 LAB — URINALYSIS, ROUTINE W REFLEX MICROSCOPIC
Bacteria, UA: NONE SEEN
Bilirubin Urine: NEGATIVE
Glucose, UA: NEGATIVE mg/dL
Ketones, ur: NEGATIVE mg/dL
Leukocytes, UA: NEGATIVE
Nitrite: NEGATIVE
Protein, ur: NEGATIVE mg/dL
Specific Gravity, Urine: 1.023 (ref 1.005–1.030)
pH: 6 (ref 5.0–8.0)

## 2018-04-14 LAB — POC OCCULT BLOOD, ED: Fecal Occult Bld: NEGATIVE

## 2018-04-14 MED ORDER — POLYETHYLENE GLYCOL 3350 17 G PO PACK: 17.0000 g | PACK | Freq: Every day | ORAL | 0 refills | Status: DC

## 2018-04-14 MED ORDER — DOCUSATE SODIUM 250 MG PO CAPS: 250.0000 mg | ORAL_CAPSULE | Freq: Every day | ORAL | 0 refills | Status: DC

## 2018-04-14 NOTE — ED Provider Notes (Signed)
[facility] Provider Note   CSN: [id number] Arrival date & time: 04/14/18  1332     History   Chief Complaint Chief Complaint  Patient presents with  . Constipation    HPI Steven Mooney is a 32 y.o. male with history of scoliosis, gastric ulcer who presents with a 2 to 3-week history of constipation.  Patient reports he has had acholic stool with some blood intermittently.  He reports constipation and no bowel movement for the past 4 to 5 days.  He has associated rectal pain.  He denies any abdominal pain, nausea, vomiting, known hemorrhoids.  He denies any anal sexual intercourse.  He denies any penile discharge, urinary symptoms, scrotal swelling or pain, or fevers.  [facility] [place] [facility] [place] and told he has "polyps." He reports elevated was an x-ray.  Patient is unsure the [place] of the hospital [place] and no records available in care everywhere.  HPI  Past Medical History:  Diagnosis Date  . Gastric ulcer   . Scoliosis     Patient Active Problem List   Diagnosis Date Noted  . 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 No pertinent surgical history.      Home Medications    Prior to Admission medications   Medication Sig Start Date End Date Taking? Authorizing Provider  azithromycin (ZITHROMAX) 250 MG tablet Take 1 tablet (250 mg total) by mouth daily. Take first 2 tablets together, then 1 every day until finished. 03/10/18   Gilda CreasePollina, Christopher J, MD  cyclobenzaprine (FLEXERIL) 10 MG tablet Take 1 tablet (10 mg total) by mouth 3 (three) times daily as needed for muscle spasms. 12/21/17   Terrilee FilesButler, Kazuo C, MD  docusate sodium (COLACE) 250 MG capsule Take 1 capsule (250 mg total) by mouth daily. 04/14/18   Wilmon Conover, Waylan BogaAlexandra M, PA-C  hydrocortisone cream 1 % Apply 1 application topically 2 (two) times daily. Do not apply to face  01/11/18   Antony MaduraHumes, Kelly, PA-C  ibuprofen (ADVIL,MOTRIN) 800 MG tablet Take 1 tablet (800 mg total) by mouth every 8 (eight) hours as needed. 01/11/18   Antony MaduraHumes, Kelly, PA-C  oxyCODONE-acetaminophen (PERCOCET/ROXICET) 5-325 MG tablet Take 2 tablets by mouth every 4 (four) hours as needed for severe pain. 12/21/17   Terrilee FilesButler, Casen C, MD  polyethylene glycol [facility](MIRALAX) packet Take 17 g by mouth daily. 04/14/18   Emi HolesLaw, Odysseus Cada M, PA-C    Family History History reviewed. No pertinent family history.  Social History Social History   Tobacco Use  . Smoking status: Current Every Day Smoker  . Smokeless tobacco: Never Used  Substance Use Topics  . Alcohol use: Yes  . Drug use: Yes    Types: Marijuana     Allergies   Penicillins   Review of Systems Review of Systems  Constitutional: Negative for chills and fever.  HENT: Negative for facial swelling and sore throat.   Respiratory: Negative for shortness of breath.   Cardiovascular: Negative for chest pain.  Gastrointestinal: Positive for anal bleeding and constipation. Negative for abdominal pain, nausea and vomiting.  Genitourinary: Negative for discharge, dysuria, penile pain, penile swelling, scrotal swelling and testicular pain.  Musculoskeletal: Negative for back pain.  Skin: Negative for rash and wound.  Neurological: Negative for headaches.  Psychiatric/Behavioral: The patient is not nervous/anxious.      Physical Exam Updated Vital Signs BP 134/90 (BP Location: Left  Arm)   Pulse 85   Temp 99.1 F (37.3 C) (Oral)   Resp 18   SpO2 99%   Physical Exam  Constitutional: He appears well-developed and well-nourished. No distress.  HENT:  Head: Normocephalic and atraumatic.  Mouth/Throat: Oropharynx is clear and moist. No oropharyngeal exudate.  Eyes: Pupils are equal, round, and reactive to light. Conjunctivae are normal. Right eye exhibits no discharge. Left eye exhibits no discharge. No scleral icterus.  Neck: Normal range  of motion. Neck supple. No thyromegaly present.  Cardiovascular: Normal rate, regular rhythm, normal heart sounds and intact distal pulses. Exam reveals no gallop and no friction rub.  No murmur heard. Pulmonary/Chest: Effort normal and breath sounds normal. No stridor. No respiratory distress. He has no wheezes. He has no rales.  Abdominal: Soft. Bowel sounds are normal. He exhibits no distension. There is no tenderness. There is no rebound and no guarding.  Genitourinary: Rectal exam shows external hemorrhoid (not thrombosed) and tenderness. Rectal exam shows guaiac negative stool. Prostate is tender.  Genitourinary Comments: Patient is diffusely tender on rectal exam, no more on the prostate than posteriorly  Musculoskeletal: He exhibits no edema.  Lymphadenopathy:    He has no cervical adenopathy.  Neurological: He is alert. Coordination normal.  Skin: Skin is warm and dry. No rash noted. He is not diaphoretic. No pallor.  Psychiatric: He has a normal mood and affect.  Nursing note and vitals reviewed.    ED Treatments / Results  Labs (all labs ordered are listed, but only abnormal results are displayed) Labs Reviewed  URINALYSIS, ROUTINE W REFLEX MICROSCOPIC - Abnormal; Notable for the following components:      Result Value   Hgb urine dipstick SMALL (*)    All other components within normal limits  POC OCCULT BLOOD, ED  GC/CHLAMYDIA PROBE AMP ([place]) NOT AT [facility]    EKG None  Radiology No results found.  Procedures Procedures (including critical care time)  Medications Ordered in ED Medications - No data to display   Initial Impression / Assessment and Plan / ED Course  I have reviewed the triage vital signs and the nursing notes.  Pertinent labs & imaging results that were available during my care of the patient were reviewed by me and considered in my medical decision making (see chart for details).     Patient with constipation and rectal pain.  UA  shows small hematuria.  GC/chlamydia sent.  Fecal occult negative.  Patient with benign abdominal exam, no focal tenderness.  Abdomen is soft.  Low suspicion of prostatitis or proctitis.  Suspect constipation.  Will discharge home with MiraLAX and Colace.  High-fiber diet discussed.  Patient advised to follow-up with establish care with a PCP.  Return precautions discussed.  Patient understands and agrees with plan.  Patient vitals stable throughout ED course and discharged in satisfactory condition.  Patient also evaluated by my attending, Dr. Madilyn Hook, who guided the patient's management and agrees with plan.  Final Clinical Impressions(s) / ED Diagnoses   Final diagnoses:  Other constipation    ED Discharge Orders         Ordered    polyethylene glycol (MIRALAX) packet  Daily     04/14/18 1504    docusate sodium (COLACE) 250 MG capsule  Daily     04/14/18 [address] PA-C 04/14/18 1507    Tilden Fossa, MD 04/14/18 1534

## 2018-04-14 NOTE — ED Triage Notes (Signed)
Pt presents to ED for assessment of rectal pain and intermittent abdominal pain with constipation.  Was seen in [place] last week and told he had "polyps".

## 2018-04-14 NOTE — Discharge Instructions (Signed)
Take MiraLAX daily until you produce regular bowel movements.  Take Colace daily to help soften your stools.  Make sure to eat a high-fiber diet.  Please [facility] to establish care with a primary care provider and follow-up if your symptoms are not improving.  Please return the emergency department if you develop any severe abdominal pain, large amounts of bloody stool, or any other concerning symptoms.

## 2018-04-14 NOTE — ED Notes (Signed)
Pt verbalized understanding of dc instructions, vss, resp e/u, nad.  

## 2018-04-16 LAB — GC/CHLAMYDIA PROBE AMP (~~LOC~~) NOT AT ARMC
Chlamydia: NEGATIVE
Neisseria Gonorrhea: NEGATIVE

## 2018-05-21 ENCOUNTER — Emergency Department (HOSPITAL_COMMUNITY): Payer: Self-pay

## 2018-05-21 ENCOUNTER — Other Ambulatory Visit: Payer: Self-pay

## 2018-05-21 ENCOUNTER — Encounter (HOSPITAL_COMMUNITY): Payer: Self-pay

## 2018-05-21 ENCOUNTER — Emergency Department (HOSPITAL_COMMUNITY)
Admission: EM | Admit: 2018-05-21 | Discharge: 2018-05-21 | Disposition: A | Discharge status: Home / Self Care | Payer: Self-pay | Attending: Emergency Medicine | Admitting: Emergency Medicine

## 2018-05-21 DIAGNOSIS — R0789 Other chest pain: Secondary | ICD-10-CM | POA: Insufficient documentation

## 2018-05-21 DIAGNOSIS — Z79899 Other long term (current) drug therapy: Secondary | ICD-10-CM | POA: Insufficient documentation

## 2018-05-21 DIAGNOSIS — F1290 Cannabis use, unspecified, uncomplicated: Secondary | ICD-10-CM | POA: Insufficient documentation

## 2018-05-21 DIAGNOSIS — F17210 Nicotine dependence, cigarettes, uncomplicated: Secondary | ICD-10-CM | POA: Insufficient documentation

## 2018-05-21 DIAGNOSIS — R079 Chest pain, unspecified: Secondary | ICD-10-CM

## 2018-05-21 LAB — CBC WITH DIFFERENTIAL/PLATELET
Abs Immature Granulocytes: 0.01 10*3/uL (ref 0.00–0.07)
BASOS PCT: 1 %
Basophils Absolute: 0 10*3/uL (ref 0.0–0.1)
Eosinophils Absolute: 0.3 10*3/uL (ref 0.0–0.5)
Eosinophils Relative: 5 %
HCT: 41.4 % (ref 39.0–52.0)
HEMOGLOBIN: 13.7 g/dL (ref 13.0–17.0)
IMMATURE GRANULOCYTES: 0 %
LYMPHS PCT: 31 %
Lymphs Abs: 2 10*3/uL (ref 0.7–4.0)
MCH: 29.5 pg (ref 26.0–34.0)
MCHC: 33.1 g/dL (ref 30.0–36.0)
MCV: 89 fL (ref 80.0–100.0)
Monocytes Absolute: 0.4 10*3/uL (ref 0.1–1.0)
Monocytes Relative: 6 %
NEUTROS ABS: 3.8 10*3/uL (ref 1.7–7.7)
NEUTROS PCT: 57 %
PLATELETS: 268 10*3/uL (ref 150–400)
RBC: 4.65 MIL/uL (ref 4.22–5.81)
RDW: 13.6 % (ref 11.5–15.5)
WBC: 6.6 10*3/uL (ref 4.0–10.5)
nRBC: 0 % (ref 0.0–0.2)

## 2018-05-21 LAB — LIPASE, BLOOD: Lipase: 31 U/L (ref 11–51)

## 2018-05-21 LAB — COMPREHENSIVE METABOLIC PANEL
ALBUMIN: 4.3 g/dL (ref 3.5–5.0)
ALK PHOS: 61 U/L (ref 38–126)
ALT: 25 U/L (ref 0–44)
AST: 44 U/L — AB (ref 15–41)
Anion gap: 13 (ref 5–15)
BUN: 9 mg/dL (ref 6–20)
CHLORIDE: 101 mmol/L (ref 98–111)
CO2: 25 mmol/L (ref 22–32)
CREATININE: 1.36 mg/dL — AB (ref 0.61–1.24)
Calcium: 9.5 mg/dL (ref 8.9–10.3)
GFR calc Af Amer: >60 mL/min (ref >60)
GFR calc non Af Amer: >60 mL/min (ref >60)
GLUCOSE: 90 mg/dL (ref 70–99)
Potassium: 3.7 mmol/L (ref 3.5–5.1)
SODIUM: 139 mmol/L (ref 135–145)
Total Bilirubin: 1.6 mg/dL — ABNORMAL HIGH (ref 0.3–1.2)
Total Protein: 7.1 g/dL (ref 6.5–8.1)

## 2018-05-21 LAB — TROPONIN I

## 2018-05-21 MED ORDER — ALUM & MAG HYDROXIDE-SIMETH 200-200-20 MG/5ML PO SUSP
30.0000 mL | Freq: Once | ORAL | Status: AC
  Administered 2018-05-21: 30 mL via ORAL
  Filled 2018-05-21: qty 30

## 2018-05-21 MED ORDER — ACETAMINOPHEN 500 MG PO TABS
1000.0000 mg | ORAL_TABLET | Freq: Once | ORAL | Status: AC
  Administered 2018-05-21: 1000 mg via ORAL
  Filled 2018-05-21: qty 2

## 2018-05-21 MED ORDER — PANTOPRAZOLE SODIUM 40 MG PO TBEC
40.0000 mg | DELAYED_RELEASE_TABLET | Freq: Every day | ORAL | Status: DC
  Administered 2018-05-21: 40 mg via ORAL
  Filled 2018-05-21: qty 1

## 2018-05-21 MED ORDER — OMEPRAZOLE 20 MG PO CPDR: 20.0000 mg | DELAYED_RELEASE_CAPSULE | Freq: Every day | ORAL | 0 refills | Status: DC

## 2018-05-21 MED ORDER — LIDOCAINE VISCOUS HCL 2 % MT SOLN
15.0000 mL | Freq: Once | OROMUCOSAL | Status: AC
  Administered 2018-05-21: 15 mL via ORAL
  Filled 2018-05-21: qty 15

## 2018-05-21 NOTE — ED Provider Notes (Signed)
[facility] EMERGENCY DEPARTMENT Provider Note   CSN: [id number] Arrival date & time: 05/21/18  0057     History   Chief Complaint Chief Complaint  Patient presents with  . Chest Pain    HPI Steven Mooney is a 32 y.o. male.   32 year old male with a history of adjustment disorder presents to the emergency department for evaluation of chest pain.  States that he developed left-sided chest pain yesterday morning.  He took Tylenol for this with relief.  Chest pain returned again this morning, but was not as strong as yesterday.  Describes the pain is constant and sharp, worse with sitting or laying down.  It is nonradiating.  He did not take any medications for his symptoms this morning.  Feels more short of breath when his pain is present.  Has had some associated nausea with one episode of vomiting yesterday, but no emesis today.  Denies fevers, diarrhea, urinary symptoms, history of abdominal surgeries.  The history is provided by the patient. No language interpreter was used.  Chest Pain      Past Medical History:  Diagnosis Date  . Gastric ulcer   . Scoliosis     Patient Active Problem List   Diagnosis Date Noted  . 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 No pertinent surgical history.      Home Medications    Prior to Admission medications   Medication Sig Start Date End Date Taking? Authorizing Provider  azithromycin (ZITHROMAX) 250 MG tablet Take 1 tablet (250 mg total) by mouth daily. Take first 2 tablets together, then 1 every day until finished. 03/10/18   Gilda Crease, MD  cyclobenzaprine (FLEXERIL) 10 MG tablet Take 1 tablet (10 mg total) by mouth 3 (three) times daily as needed for muscle spasms. 12/21/17   Terrilee Files, MD  docusate sodium (COLACE) 250 MG capsule Take 1 capsule (250 mg total) by mouth daily. 04/14/18   Law, Waylan Boga, PA-C  hydrocortisone cream  1 % Apply 1 application topically 2 (two) times daily. Do not apply to face 01/11/18   Antony Madura, PA-C  ibuprofen (ADVIL,MOTRIN) 800 MG tablet Take 1 tablet (800 mg total) by mouth every 8 (eight) hours as needed. 01/11/18   Antony Madura, PA-C  omeprazole (PRILOSEC) 20 MG capsule Take 1 capsule (20 mg total) by mouth daily. 05/21/18   Antony Madura, PA-C  oxyCODONE-acetaminophen (PERCOCET/ROXICET) 5-325 MG tablet Take 2 tablets by mouth every 4 (four) hours as needed for severe pain. 12/21/17   Terrilee Files, MD  polyethylene glycol [facility]) packet Take 17 g by mouth daily. 04/14/18   Emi Holes, PA-C    Family History History reviewed. No pertinent family history.  Social History Social History   Tobacco Use  . Smoking status: Current Every Day Smoker    Packs/day: 0.25  . Smokeless tobacco: Never Used  Substance Use Topics  . Alcohol use: Yes    Comment: ocassionally  . Drug use: Yes    Types: Marijuana     Allergies   Penicillins   Review of Systems Review of Systems  Cardiovascular: Positive for chest pain.  Ten systems reviewed and are negative for acute change, except as noted in the HPI.    Physical Exam Updated Vital Signs BP 115/77 (BP Location: Right Arm)   Pulse 66   Temp 97.8 F (36.6 C) (Oral)   Resp 14   Ht 6' (  1.829 m)   Wt 104.3 kg   SpO2 98%   BMI 31.19 kg/m   Physical Exam Vitals signs and nursing note reviewed.  Constitutional:      General: He is not in acute distress.    Appearance: He is well-developed. He is not diaphoretic.     Comments: Nontoxic appearing and in NAD. Multiple facial tattoos.   HENT:     Head: Normocephalic and atraumatic.     Right Ear: External ear normal.     Left Ear: External ear normal.  Eyes:     General: No scleral icterus.    Conjunctiva/sclera: Conjunctivae normal.  Neck:     Musculoskeletal: Normal range of motion.  Cardiovascular:     Rate and Rhythm: Normal rate and regular rhythm.      Pulses: Normal pulses.  Pulmonary:     Effort: Pulmonary effort is normal. No respiratory distress.     Breath sounds: No wheezing or rhonchi.     Comments: Respirations even and unlabored.  Lungs clear bilaterally. Abdominal:     Palpations: Abdomen is soft.  Musculoskeletal: Normal range of motion.  Skin:    General: Skin is warm and dry.     Coloration: Skin is not pale.     Findings: No erythema or rash.  Neurological:     Mental Status: He is alert and oriented to person, place, and time.     Coordination: Coordination normal.     Comments: GCS 15. Moving all extremities.  Psychiatric:        Behavior: Behavior normal.      ED Treatments / Results  Labs (all labs ordered are listed, but only abnormal results are displayed) Labs Reviewed  COMPREHENSIVE METABOLIC PANEL - Abnormal; Notable for the following components:      Result Value   Creatinine, Ser 1.36 (*)    AST 44 (*)    Total Bilirubin 1.6 (*)    All other components within normal limits  CBC WITH DIFFERENTIAL/PLATELET  LIPASE, BLOOD  TROPONIN I    EKG EKG Interpretation  Date/Time:  Monday May 21 2018 01:13:59 EST Ventricular Rate:  76 PR Interval:    QRS Duration: 84 QT Interval:  356 QTC Calculation: 401 R Axis:   83 Text Interpretation:  Sinus rhythm ST elev, probable normal early repol pattern Confirmed by Geoffery LyonseLo, Douglas ([id number]) on 05/21/2018 1:43:23 AM   Radiology Dg Chest 2 View  Result Date: 05/21/2018 CLINICAL DATA:  Left-sided chest pain EXAM: CHEST - 2 VIEW COMPARISON:  09/25/2017 FINDINGS: The heart size and mediastinal contours are within normal limits. Both lungs are clear. The visualized skeletal structures are unremarkable. IMPRESSION: No active cardiopulmonary disease. Electronically Signed   By: Alcide CleverMark  Lukens M.D.   On: 05/21/2018 02:00    Procedures Procedures (including critical care time)  Medications Ordered in ED Medications  pantoprazole (PROTONIX) EC tablet 40 mg  (40 mg Oral Given 05/21/18 0417)  acetaminophen (TYLENOL) tablet 1,000 mg (1,000 mg Oral Given 05/21/18 0400)  alum & mag hydroxide-simeth (MAALOX/MYLANTA) 200-200-20 MG/5ML suspension 30 mL (30 mLs Oral Given 05/21/18 0418)    And  lidocaine (XYLOCAINE) 2 % viscous mouth solution 15 mL (15 mLs Oral Given 05/21/18 0418)     Initial Impression / Assessment and Plan / ED Course  I have reviewed the triage vital signs and the nursing notes.  Pertinent labs & imaging results that were available during my care of the patient were reviewed by me and considered in  my medical decision making (see chart for details).     Patient presents to the emergency department for evaluation of chest pain.  Low suspicion for emergent cardiac etiology given reassuring workup today.  EKG is nonischemic and troponin negative.  Patient has a heart score of 1 consistent with low risk of acute coronary event.  Chest x-ray without evidence of mediastinal widening to suggest dissection.  No pneumothorax, pneumonia, pleural effusion.  Pulmonary embolus further considered; however, patient without tachycardia, tachypnea, dyspnea, hypoxia.  Patient is PERC negative.  He endorses a history of peptic ulcer disease.  Suspect that symptoms may be secondary to PUD or gastritis.  The patient will be placed back on course of Prilosec.  He has been encouraged to follow-up with a primary care doctor.  I do not believe further emergent work-up is indicated at this time.  Return precautions discussed and provided. Patient discharged in stable condition with no unaddressed concerns.   Final Clinical Impressions(s) / ED Diagnoses   Final diagnoses:  Nonspecific chest pain    ED Discharge Orders         Ordered    omeprazole (PRILOSEC) 20 MG capsule  Daily     05/21/18 0412           Antony MaduraHumes, Jujhar Everett, PA-C 05/21/18 [id number]    Geoffery Lyonselo, Douglas, MD 05/21/18 [phone]

## 2018-05-21 NOTE — Discharge Instructions (Signed)
Your work-up in the emergency department today was reassuring and did not reveal a concerning cause of your symptoms.  We recommend that you restart use of Prilosec daily as prescribed.  Follow-up with a primary care doctor.  You may return to the ED for new or concerning symptoms.

## 2018-05-21 NOTE — ED Triage Notes (Signed)
Pt from home w/ multiple complaints. He complains of left sided chest pain, N/V, SOB, and "a weird itch all over my body." The chest pain began yesterday, is constant, and feels like a squeezing sensation. The pain is non-radiating. The pain is non-reproducible with palpation.

## 2018-05-31 ENCOUNTER — Other Ambulatory Visit: Payer: Self-pay

## 2018-05-31 ENCOUNTER — Emergency Department (HOSPITAL_COMMUNITY)
Admission: EM | Admit: 2018-05-31 | Discharge: 2018-05-31 | Disposition: A | Discharge status: Home / Self Care | Payer: Self-pay | Attending: Emergency Medicine | Admitting: Emergency Medicine

## 2018-05-31 DIAGNOSIS — F17200 Nicotine dependence, unspecified, uncomplicated: Secondary | ICD-10-CM | POA: Insufficient documentation

## 2018-05-31 DIAGNOSIS — F419 Anxiety disorder, unspecified: Secondary | ICD-10-CM | POA: Insufficient documentation

## 2018-05-31 DIAGNOSIS — R109 Unspecified abdominal pain: Secondary | ICD-10-CM | POA: Insufficient documentation

## 2018-05-31 DIAGNOSIS — R112 Nausea with vomiting, unspecified: Secondary | ICD-10-CM | POA: Insufficient documentation

## 2018-05-31 DIAGNOSIS — K920 Hematemesis: Secondary | ICD-10-CM | POA: Insufficient documentation

## 2018-05-31 LAB — URINALYSIS, ROUTINE W REFLEX MICROSCOPIC
BILIRUBIN URINE: NEGATIVE
Glucose, UA: NEGATIVE mg/dL
HGB URINE DIPSTICK: NEGATIVE
KETONES UR: NEGATIVE mg/dL
Leukocytes, UA: NEGATIVE
NITRITE: NEGATIVE
PROTEIN: NEGATIVE mg/dL
Specific Gravity, Urine: 1.016 (ref 1.005–1.030)
pH: 9 — ABNORMAL HIGH (ref 5.0–8.0)

## 2018-05-31 NOTE — ED Provider Notes (Signed)
[facility]-EMERGENCY DEPT Provider Note   CSN: [id number] Arrival date & time: 05/31/18  0213     History   Chief Complaint Chief Complaint  Patient presents with  . Anxiety  . Emesis    HPI Steven BectonMichael Mooney is a 33 y.o. male.  Patient to ED for evaluation of nausea and vomiting with hematemesis. He denies abdominal pain but reports left non-radiating, sharp flank pain that is persistent. He reports he called EMS because he saw blood in his emesis and while in transport he had 2 episodes of heart racing, facial muscle tightening, SOB. Each episode lasted 3 minutes or less. He is currently asymptomatic without nausea, vomiting, abdominal pain, SOB, palpitations or muscle tightening. He reports a history of gastric ulcers that have bled but denies current or recent epigastric or abdominal pain. No recent alcohol use.   The history is provided by the patient. No language interpreter was used.  Anxiety  Associated symptoms include shortness of breath. Pertinent negatives include no chest pain and no abdominal pain.  Emesis   Pertinent negatives include no abdominal pain, no chills, no cough, no diarrhea and no fever.    Past Medical History:  Diagnosis Date  . Gastric ulcer   . Scoliosis     Patient Active Problem List   Diagnosis Date Noted  . 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      Home Medications    Prior to Admission medications   Medication Sig Start Date End Date Taking? Authorizing Provider  azithromycin (ZITHROMAX) 250 MG tablet Take 1 tablet (250 mg total) by mouth daily. Take first 2 tablets together, then 1 every day until finished. Patient not taking: Reported on 05/31/2018 03/10/18   Gilda CreasePollina, Christopher J, MD  cyclobenzaprine (FLEXERIL) 10 MG tablet Take 1 tablet (10 mg total) by mouth 3 (three) times daily as needed for muscle spasms. Patient not taking:  Reported on 05/31/2018 12/21/17   Terrilee FilesButler, Tetsuo C, MD  docusate sodium (COLACE) 250 MG capsule Take 1 capsule (250 mg total) by mouth daily. Patient not taking: Reported on 05/31/2018 04/14/18   Emi HolesLaw, Alexandra M, PA-C  hydrocortisone cream 1 % Apply 1 application topically 2 (two) times daily. Do not apply to face Patient not taking: Reported on 05/31/2018 01/11/18   Antony MaduraHumes, Kelly, PA-C  ibuprofen (ADVIL,MOTRIN) 800 MG tablet Take 1 tablet (800 mg total) by mouth every 8 (eight) hours as needed. Patient not taking: Reported on 05/31/2018 01/11/18   Antony MaduraHumes, Kelly, PA-C  omeprazole (PRILOSEC) 20 MG capsule Take 1 capsule (20 mg total) by mouth daily. Patient not taking: Reported on 05/31/2018 05/21/18   Antony MaduraHumes, Kelly, PA-C  oxyCODONE-acetaminophen (PERCOCET/ROXICET) 5-325 MG tablet Take 2 tablets by mouth every 4 (four) hours as needed for severe pain. Patient not taking: Reported on 05/31/2018 12/21/17   Terrilee FilesButler, Teandre C, MD  polyethylene glycol [facility](MIRALAX) packet Take 17 g by mouth daily. Patient not taking: Reported on 05/31/2018 04/14/18   Emi HolesLaw, Alexandra M, PA-C    Family History No family history on file.  Social History Social History   Tobacco Use  . Smoking status: Current Every Day Smoker    Packs/day: 0.25  . Smokeless tobacco: Never Used  Substance Use Topics  . Alcohol use: Yes    Comment: ocassionally  . Drug use: Yes    Types: Marijuana     Allergies   Penicillins   Review of  Systems Review of Systems  Constitutional: Negative for chills and fever.  HENT: Negative.   Respiratory: Positive for shortness of breath. Negative for cough.        See HPI.  Cardiovascular: Positive for palpitations. Negative for chest pain.       See HPI.  Gastrointestinal: Positive for vomiting. Negative for abdominal pain, blood in stool, constipation and diarrhea.  Musculoskeletal: Negative.        Facial muscle 'tightening'.  Skin: Negative.   Neurological: Negative.      Physical  Exam Updated Vital Signs BP (!) 136/105   Pulse 64   Temp 97.9 F (36.6 C) (Oral)   Resp 16   SpO2 100%   Physical Exam Vitals signs and nursing note reviewed.  Constitutional:      Appearance: He is well-developed.  HENT:     Head: Normocephalic.  Neck:     Musculoskeletal: Normal range of motion and neck supple.  Cardiovascular:     Rate and Rhythm: Normal rate and regular rhythm.  Pulmonary:     Effort: Pulmonary effort is normal.     Breath sounds: Normal breath sounds. No wheezing, rhonchi or rales.  Abdominal:     General: Bowel sounds are normal.     Palpations: Abdomen is soft.     Tenderness: There is no abdominal tenderness. There is no guarding or rebound.  Musculoskeletal: Normal range of motion.       Back:  Skin:    General: Skin is warm and dry.     Findings: No rash.  Neurological:     Mental Status: He is alert.     Cranial Nerves: No cranial nerve deficit.      ED Treatments / Results  Labs (all labs ordered are listed, but only abnormal results are displayed) Labs Reviewed  URINALYSIS, ROUTINE W REFLEX MICROSCOPIC   Results for orders placed or performed during the hospital encounter of 05/31/18  Urinalysis, Routine w reflex microscopic  Result Value Ref Range   Color, Urine YELLOW YELLOW   APPearance CLEAR CLEAR   Specific Gravity, Urine 1.016 1.005 - 1.030   pH 9.0 (H) 5.0 - 8.0   Glucose, UA NEGATIVE NEGATIVE mg/dL   Hgb urine dipstick NEGATIVE NEGATIVE   Bilirubin Urine NEGATIVE NEGATIVE   Ketones, ur NEGATIVE NEGATIVE mg/dL   Protein, ur NEGATIVE NEGATIVE mg/dL   Nitrite NEGATIVE NEGATIVE   Leukocytes, UA NEGATIVE NEGATIVE     EKG None  Radiology No results found.  Procedures Procedures (including critical care time)  Medications Ordered in ED Medications - No data to display   Initial Impression / Assessment and Plan / ED Course  I have reviewed the triage vital signs and the nursing notes.  Pertinent labs &  imaging results that were available during my care of the patient were reviewed by me and considered in my medical decision making (see chart for details).     Patient to ED for evaluation of vomiting x 2 episodes where he feels he saw blood in the emesis. No ongoing nausea and no further vomiting. He reports 2 episodes he feels were anxiety/panic attacks while in route to the ED. No further symptoms of anxiety/panic while here.   The patient is very well appearing. He reports soreness in the left flank area. He is tender in this area. UA is negative for blood or infection. VSS, afebrile. Doubt UTI, pyelonephritis or kidney stone. Suspect muscular soreness given reproducible tenderness of this area.  He can be discharged home. Will provide resources for PCP follow up.   Final Clinical Impressions(s) / ED Diagnoses   Final diagnoses:  None   1. Emesis 2. anxiety  ED Discharge Orders    None       Elpidio Anis, PA-C 05/31/18 [id number]    Ward, Layla Maw, DO 05/31/18 [phone]

## 2018-05-31 NOTE — Discharge Instructions (Signed)
Please follow up by establishing with a primary care provider for ongoing routine medical concerns. Always, return to the emergency department with any urgent medical needs.

## 2018-05-31 NOTE — ED Notes (Signed)
Bed: [id number] Expected date:  Expected time:  Means of arrival:  Comments: EMS 33 yo male abd pain

## 2018-05-31 NOTE — ED Notes (Signed)
Pt was educated on anxiety and utilizing primary care doctor for control. Pt left before e-signing on topaz for discharge instructions.

## 2018-05-31 NOTE — ED Triage Notes (Signed)
Per EMS, Pt was walking home and had an emesis episode. Pt believes that there was blood in the vomit. Did not have any more emesis episodes with EMS. While ride with EMS pt began to have severe anxiety. Pt has htx of gastric ulcers and anxiety attacks. Last gastric ulcer was apprx 10 yrs ago.

## 2018-06-10 ENCOUNTER — Encounter (HOSPITAL_COMMUNITY): Payer: Self-pay | Admitting: Emergency Medicine

## 2018-06-10 ENCOUNTER — Other Ambulatory Visit: Payer: Self-pay

## 2018-06-10 ENCOUNTER — Emergency Department (HOSPITAL_COMMUNITY)
Admission: EM | Admit: 2018-06-10 | Discharge: 2018-06-10 | Disposition: A | Discharge status: Home / Self Care | Payer: Self-pay | Attending: Emergency Medicine | Admitting: Emergency Medicine

## 2018-06-10 DIAGNOSIS — F419 Anxiety disorder, unspecified: Secondary | ICD-10-CM | POA: Insufficient documentation

## 2018-06-10 DIAGNOSIS — Z5321 Procedure and treatment not carried out due to patient leaving prior to being seen by health care provider: Secondary | ICD-10-CM | POA: Insufficient documentation

## 2018-06-10 NOTE — ED Triage Notes (Signed)
Pt presents by [facility] for evaluation of anxiety attack after walking for over 2 miles. Pt ambulated in to triage without difficulty. No acute distress at this time.

## 2018-09-19 ENCOUNTER — Other Ambulatory Visit: Payer: Self-pay

## 2018-09-19 ENCOUNTER — Emergency Department (HOSPITAL_COMMUNITY)
Admission: EM | Admit: 2018-09-19 | Discharge: 2018-09-20 | Discharge status: Against Medical Advice | Payer: Self-pay | Attending: Emergency Medicine | Admitting: Emergency Medicine

## 2018-09-19 ENCOUNTER — Emergency Department (HOSPITAL_COMMUNITY): Payer: Self-pay

## 2018-09-19 DIAGNOSIS — T148XXA Other injury of unspecified body region, initial encounter: Secondary | ICD-10-CM

## 2018-09-19 DIAGNOSIS — Y92512 Supermarket, store or market as the place of occurrence of the external cause: Secondary | ICD-10-CM | POA: Insufficient documentation

## 2018-09-19 DIAGNOSIS — Y999 Unspecified external cause status: Secondary | ICD-10-CM | POA: Insufficient documentation

## 2018-09-19 DIAGNOSIS — S0103XA Puncture wound without foreign body of scalp, initial encounter: Secondary | ICD-10-CM | POA: Insufficient documentation

## 2018-09-19 DIAGNOSIS — S0183XA Puncture wound without foreign body of other part of head, initial encounter: Secondary | ICD-10-CM | POA: Insufficient documentation

## 2018-09-19 DIAGNOSIS — Y939 Activity, unspecified: Secondary | ICD-10-CM | POA: Insufficient documentation

## 2018-09-19 DIAGNOSIS — Z5329 Procedure and treatment not carried out because of patient's decision for other reasons: Secondary | ICD-10-CM | POA: Insufficient documentation

## 2018-09-19 DIAGNOSIS — S41032A Puncture wound without foreign body of left shoulder, initial encounter: Secondary | ICD-10-CM | POA: Insufficient documentation

## 2018-09-19 DIAGNOSIS — F17200 Nicotine dependence, unspecified, uncomplicated: Secondary | ICD-10-CM | POA: Insufficient documentation

## 2018-09-19 DIAGNOSIS — T07XXXA Unspecified multiple injuries, initial encounter: Secondary | ICD-10-CM

## 2018-09-19 DIAGNOSIS — J449 Chronic obstructive pulmonary disease, unspecified: Secondary | ICD-10-CM | POA: Insufficient documentation

## 2018-09-19 HISTORY — DX: Chronic obstructive pulmonary disease, unspecified: J44.9

## 2018-09-19 MED ORDER — CLINDAMYCIN PHOSPHATE 600 MG/50ML IV SOLN
600.0000 mg | Freq: Once | INTRAVENOUS | Status: AC
  Administered 2018-09-20: 600 mg via INTRAVENOUS
  Filled 2018-09-19: qty 50

## 2018-09-19 MED ORDER — SODIUM CHLORIDE 0.9 % IV BOLUS: 1000.0000 mL | Freq: Once | INTRAVENOUS | Status: DC

## 2018-09-19 MED ORDER — SODIUM CHLORIDE 0.9 % IV BOLUS
125.0000 mL | Freq: Once | INTRAVENOUS | Status: AC
  Administered 2018-09-20: 1000 mL via INTRAVENOUS

## 2018-09-19 MED ORDER — TETANUS-DIPHTH-ACELL PERTUSSIS 5-2.5-18.5 LF-MCG/0.5 IM SUSP
0.5000 mL | Freq: Once | INTRAMUSCULAR | Status: DC
  Filled 2018-09-19: qty 0.5

## 2018-09-19 MED ORDER — SODIUM CHLORIDE 0.9 % IV SOLN: INTRAVENOUS | Status: DC

## 2018-09-19 NOTE — ED Notes (Signed)
Ma nuel bp 156/80
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
## 2018-09-19 NOTE — ED Notes (Signed)
Pt alert
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
## 2018-09-20 ENCOUNTER — Emergency Department (HOSPITAL_COMMUNITY): Payer: Self-pay

## 2018-09-20 ENCOUNTER — Encounter (HOSPITAL_COMMUNITY): Payer: Self-pay

## 2018-09-20 ENCOUNTER — Other Ambulatory Visit: Payer: Self-pay

## 2018-09-20 ENCOUNTER — Encounter (HOSPITAL_COMMUNITY): Payer: Self-pay | Admitting: Emergency Medicine

## 2018-09-20 ENCOUNTER — Encounter (HOSPITAL_COMMUNITY): Payer: Self-pay | Admitting: *Deleted

## 2018-09-20 ENCOUNTER — Emergency Department (HOSPITAL_COMMUNITY)
Admission: EM | Admit: 2018-09-20 | Discharge: 2018-09-20 | Disposition: A | Discharge status: Home / Self Care | Payer: Self-pay | Attending: Emergency Medicine | Admitting: Emergency Medicine

## 2018-09-20 DIAGNOSIS — T148XXA Other injury of unspecified body region, initial encounter: Secondary | ICD-10-CM

## 2018-09-20 DIAGNOSIS — S0101XA Laceration without foreign body of scalp, initial encounter: Secondary | ICD-10-CM | POA: Insufficient documentation

## 2018-09-20 DIAGNOSIS — Y929 Unspecified place or not applicable: Secondary | ICD-10-CM | POA: Insufficient documentation

## 2018-09-20 DIAGNOSIS — Y939 Activity, unspecified: Secondary | ICD-10-CM | POA: Insufficient documentation

## 2018-09-20 DIAGNOSIS — F17200 Nicotine dependence, unspecified, uncomplicated: Secondary | ICD-10-CM | POA: Insufficient documentation

## 2018-09-20 DIAGNOSIS — T1490XA Injury, unspecified, initial encounter: Secondary | ICD-10-CM

## 2018-09-20 DIAGNOSIS — J449 Chronic obstructive pulmonary disease, unspecified: Secondary | ICD-10-CM | POA: Insufficient documentation

## 2018-09-20 DIAGNOSIS — S21332A Puncture wound without foreign body of left front wall of thorax with penetration into thoracic cavity, initial encounter: Secondary | ICD-10-CM | POA: Insufficient documentation

## 2018-09-20 DIAGNOSIS — S0181XA Laceration without foreign body of other part of head, initial encounter: Secondary | ICD-10-CM | POA: Insufficient documentation

## 2018-09-20 DIAGNOSIS — Y999 Unspecified external cause status: Secondary | ICD-10-CM | POA: Insufficient documentation

## 2018-09-20 LAB — BPAM RBC
Blood Product Expiration Date: 202005012359
Blood Product Expiration Date: 202005012359
ISSUE DATE / TIME: 202004222327
ISSUE DATE / TIME: 202004222328
Unit Type and Rh: 5100
Unit Type and Rh: 5100

## 2018-09-20 LAB — COMPREHENSIVE METABOLIC PANEL
ALT: 24 U/L (ref 0–44)
AST: 21 U/L (ref 15–41)
Albumin: 4.3 g/dL (ref 3.5–5.0)
Alkaline Phosphatase: 62 U/L (ref 38–126)
Anion gap: 11 (ref 5–15)
BUN: 6 mg/dL (ref 6–20)
CO2: 24 mmol/L (ref 22–32)
Calcium: 9.1 mg/dL (ref 8.9–10.3)
Chloride: 105 mmol/L (ref 98–111)
Creatinine, Ser: 1.25 mg/dL — ABNORMAL HIGH (ref 0.61–1.24)
GFR calc Af Amer: >60 mL/min (ref >60)
GFR calc non Af Amer: >60 mL/min (ref >60)
Glucose, Bld: 117 mg/dL — ABNORMAL HIGH (ref 70–99)
Potassium: 3.3 mmol/L — ABNORMAL LOW (ref 3.5–5.1)
Sodium: 140 mmol/L (ref 135–145)
Total Bilirubin: 1 mg/dL (ref 0.3–1.2)
Total Protein: 7.3 g/dL (ref 6.5–8.1)

## 2018-09-20 LAB — PREPARE FRESH FROZEN PLASMA

## 2018-09-20 LAB — BPAM FFP
Blood Product Expiration Date: 202004252359
Blood Product Expiration Date: 202004252359
ISSUE DATE / TIME: 202004222327
ISSUE DATE / TIME: 202004222327
Unit Type and Rh: 6200
Unit Type and Rh: 6200

## 2018-09-20 LAB — ABO/RH: ABO/RH(D): B POS

## 2018-09-20 LAB — TYPE AND SCREEN
ABO/RH(D): B POS
Antibody Screen: NEGATIVE
Unit division: 0
Unit division: 0

## 2018-09-20 LAB — CBC
HCT: 43.1 % (ref 39.0–52.0)
Hemoglobin: 14.5 g/dL (ref 13.0–17.0)
MCH: 30.7 pg (ref 26.0–34.0)
MCHC: 33.6 g/dL (ref 30.0–36.0)
MCV: 91.1 fL (ref 80.0–100.0)
Platelets: 247 10*3/uL (ref 150–400)
RBC: 4.73 MIL/uL (ref 4.22–5.81)
RDW: 13.7 % (ref 11.5–15.5)
WBC: 6.5 10*3/uL (ref 4.0–10.5)
nRBC: 0 % (ref 0.0–0.2)

## 2018-09-20 LAB — LACTIC ACID, PLASMA: Lactic Acid, Venous: 1.4 mmol/L (ref 0.5–1.9)

## 2018-09-20 LAB — PROTIME-INR
INR: 1 (ref 0.8–1.2)
Prothrombin Time: 12.7 seconds (ref 11.4–15.2)

## 2018-09-20 LAB — CDS SEROLOGY

## 2018-09-20 LAB — ETHANOL: Alcohol, Ethyl (B): 225 mg/dL — ABNORMAL HIGH (ref <10)

## 2018-09-20 MED ORDER — LIDOCAINE-EPINEPHRINE (PF) 2 %-1:200000 IJ SOLN
20.0000 mL | Freq: Once | INTRAMUSCULAR | Status: AC
  Administered 2018-09-20: 20 mL
  Filled 2018-09-20: qty 20

## 2018-09-20 MED ORDER — CLINDAMYCIN HCL 300 MG PO CAPS: 300.0000 mg | ORAL_CAPSULE | Freq: Four times a day (QID) | ORAL | 0 refills | Status: DC

## 2018-09-20 MED ORDER — IOHEXOL 350 MG/ML SOLN
75.0000 mL | Freq: Once | INTRAVENOUS | Status: AC | PRN
  Administered 2018-09-20: 02:00:00 75 mL via INTRAVENOUS

## 2018-09-20 NOTE — ED Notes (Addendum)
Pt up walking around he wants to leave

## 2018-09-20 NOTE — ED Notes (Signed)
The pt has pulled olut his iv he wants to leave  He is arguing with the doctor
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
## 2018-09-20 NOTE — ED Notes (Signed)
E-signature not available, verbalized understanding of DC instructions. 

## 2018-09-20 NOTE — ED Notes (Signed)
Several staff attempted to get him to stay he would not listen

## 2018-09-20 NOTE — ED Notes (Signed)
Pt walked oout  After talking to dr Nicanor Alcon

## 2018-09-20 NOTE — ED Notes (Signed)
Pt refused his tet shot  He reports he just had these for some reason
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
## 2018-09-20 NOTE — Discharge Instructions (Addendum)
The sutures in your chin will dissolve on their own.  Return for fever, swelling, redness, discharge, or worsening symptoms.

## 2018-09-20 NOTE — ED Notes (Signed)
pts bp cuff pulse ox off  Replaced. Pt sleeping  Woke up did not answer my questions
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
## 2018-09-20 NOTE — ED Notes (Signed)
portyable xrays perfomred on arrival

## 2018-11-03 ENCOUNTER — Encounter (HOSPITAL_COMMUNITY): Payer: Self-pay

## 2018-11-03 ENCOUNTER — Emergency Department (HOSPITAL_COMMUNITY)
Admission: EM | Admit: 2018-11-03 | Discharge: 2018-11-03 | Disposition: A | Discharge status: Home / Self Care | Payer: Self-pay | Attending: Emergency Medicine | Admitting: Emergency Medicine

## 2018-11-03 ENCOUNTER — Other Ambulatory Visit: Payer: Self-pay

## 2018-11-03 DIAGNOSIS — F17210 Nicotine dependence, cigarettes, uncomplicated: Secondary | ICD-10-CM | POA: Insufficient documentation

## 2018-11-03 DIAGNOSIS — J449 Chronic obstructive pulmonary disease, unspecified: Secondary | ICD-10-CM | POA: Insufficient documentation

## 2018-11-03 DIAGNOSIS — R197 Diarrhea, unspecified: Secondary | ICD-10-CM | POA: Insufficient documentation

## 2018-11-03 DIAGNOSIS — R05 Cough: Secondary | ICD-10-CM | POA: Insufficient documentation

## 2018-11-03 LAB — CBC WITH DIFFERENTIAL/PLATELET
Abs Immature Granulocytes: 0.01 10*3/uL (ref 0.00–0.07)
Basophils Absolute: 0 10*3/uL (ref 0.0–0.1)
Basophils Relative: 1 %
Eosinophils Absolute: 0.4 10*3/uL (ref 0.0–0.5)
Eosinophils Relative: 6 %
HCT: 39.9 % (ref 39.0–52.0)
Hemoglobin: 13.7 g/dL (ref 13.0–17.0)
Immature Granulocytes: 0 %
Lymphocytes Relative: 31 %
Lymphs Abs: 1.7 10*3/uL (ref 0.7–4.0)
MCH: 31.1 pg (ref 26.0–34.0)
MCHC: 34.3 g/dL (ref 30.0–36.0)
MCV: 90.5 fL (ref 80.0–100.0)
Monocytes Absolute: 0.4 10*3/uL (ref 0.1–1.0)
Monocytes Relative: 7 %
Neutro Abs: 3.1 10*3/uL (ref 1.7–7.7)
Neutrophils Relative %: 55 %
Platelets: 250 10*3/uL (ref 150–400)
RBC: 4.41 MIL/uL (ref 4.22–5.81)
RDW: 13.7 % (ref 11.5–15.5)
WBC: 5.5 10*3/uL (ref 4.0–10.5)
nRBC: 0 % (ref 0.0–0.2)

## 2018-11-03 LAB — COMPREHENSIVE METABOLIC PANEL
ALT: 40 U/L (ref 0–44)
AST: 34 U/L (ref 15–41)
Albumin: 4.1 g/dL (ref 3.5–5.0)
Alkaline Phosphatase: 88 U/L (ref 38–126)
Anion gap: 11 (ref 5–15)
BUN: 7 mg/dL (ref 6–20)
CO2: 24 mmol/L (ref 22–32)
Calcium: 9.6 mg/dL (ref 8.9–10.3)
Chloride: 103 mmol/L (ref 98–111)
Creatinine, Ser: 1.04 mg/dL (ref 0.61–1.24)
GFR calc Af Amer: >60 mL/min (ref >60)
GFR calc non Af Amer: >60 mL/min (ref >60)
Glucose, Bld: 106 mg/dL — ABNORMAL HIGH (ref 70–99)
Potassium: 3.8 mmol/L (ref 3.5–5.1)
Sodium: 138 mmol/L (ref 135–145)
Total Bilirubin: 1.2 mg/dL (ref 0.3–1.2)
Total Protein: 7.2 g/dL (ref 6.5–8.1)

## 2018-11-03 LAB — LIPASE, BLOOD: Lipase: 34 U/L (ref 11–51)

## 2018-11-03 LAB — POC OCCULT BLOOD, ED: Fecal Occult Bld: NEGATIVE

## 2018-11-03 NOTE — ED Provider Notes (Signed)
[place] EMERGENCY DEPARTMENT Provider Note   CSN: [id number] Arrival date & time: 11/03/18  1227    History   Chief Complaint Chief Complaint  Patient presents with  . Diarrhea    HPI Steven Mooney is a 33 y.o. male.     The history is provided by the patient. No language interpreter was used.  Diarrhea     33 year old male with prior history of gastric ulcer presenting for evaluation of diarrhea.  Patient reports since yesterday he noticed increased bowel movement as well as having black tarry stool.  This is new for him.  Bowel movement has increased from his baseline.  He however does not complain of any fever chills no chest pain shortness of breath abdominal pain nausea vomiting constipation or dysuria.  He denies any recent NSAID use but does admits to drinking alcohol on a regular basis.  Report prior history of gastric ulcer when he vomited blood in the past.  Endorsed history of COPD and have been using his inhaler with increased frequency for the past month.  He endorsed nonproductive cough but denies any recent sick contact with anyone with COVID-19.  No recent travel.  Denies any recent antibiotic use  Past Medical History:  Diagnosis Date  . COPD (chronic obstructive pulmonary disease) ([place])   . Gastric ulcer   . Scoliosis     Patient Active Problem List   Diagnosis Date Noted  . 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      Home Medications    Prior to Admission medications   Medication Sig Start Date End Date Taking? Authorizing Provider  azithromycin (ZITHROMAX) 250 MG tablet Take 1 tablet (250 mg total) by mouth daily. Take first 2 tablets together, then 1 every day until finished. Patient not taking: Reported on 05/31/2018 03/10/18   Orpah Greek, MD  clindamycin (CLEOCIN) 300 MG capsule Take 1 capsule (300 mg total) by mouth 4 (four) times daily.  X 7 days 09/20/18   Palumbo, April, MD  cyclobenzaprine (FLEXERIL) 10 MG tablet Take 1 tablet (10 mg total) by mouth 3 (three) times daily as needed for muscle spasms. Patient not taking: Reported on 05/31/2018 12/21/17   Hayden Rasmussen, MD  docusate sodium (COLACE) 250 MG capsule Take 1 capsule (250 mg total) by mouth daily. Patient not taking: Reported on 05/31/2018 04/14/18   Frederica Kuster, PA-C  hydrocortisone cream 1 % Apply 1 application topically 2 (two) times daily. Do not apply to face Patient not taking: Reported on 05/31/2018 01/11/18   Antonietta Breach, PA-C  ibuprofen (ADVIL,MOTRIN) 800 MG tablet Take 1 tablet (800 mg total) by mouth every 8 (eight) hours as needed. Patient not taking: Reported on 05/31/2018 01/11/18   Antonietta Breach, PA-C  omeprazole (PRILOSEC) 20 MG capsule Take 1 capsule (20 mg total) by mouth daily. Patient not taking: Reported on 05/31/2018 05/21/18   Antonietta Breach, PA-C  oxyCODONE-acetaminophen (PERCOCET/ROXICET) 5-325 MG tablet Take 2 tablets by mouth every 4 (four) hours as needed for severe pain. Patient not taking: Reported on 05/31/2018 12/21/17   Hayden Rasmussen, MD  polyethylene glycol [facility]) packet Take 17 g by mouth daily. Patient not taking: Reported on 05/31/2018 04/14/18   Frederica Kuster, PA-C    Family History No family history on file.  Social History Social History   Tobacco Use  . Smoking status: Current Every Day Smoker  Packs/day: 0.25    Types: Cigarettes  . Smokeless tobacco: Never Used  Substance Use Topics  . Alcohol use: Yes    Comment: ocassionally  . Drug use: Not Currently    Types: Marijuana     Allergies   Penicillins and Penicillins   Review of Systems Review of Systems  Gastrointestinal: Positive for diarrhea.  All other systems reviewed and are negative.    Physical Exam Updated Vital Signs BP (!) 118/96   Pulse 69   Temp 98.1 F (36.7 C) (Oral)   Resp 16   Ht 6' (1.829 m)   Wt 104.3 kg   SpO2 97%   BMI  31.19 kg/m   Physical Exam Vitals signs and nursing note reviewed.  Constitutional:      General: He is not in acute distress.    Appearance: He is well-developed.  HENT:     Head: Atraumatic.  Eyes:     Conjunctiva/sclera: Conjunctivae normal.  Neck:     Musculoskeletal: Neck supple.  Cardiovascular:     Rate and Rhythm: Normal rate and regular rhythm.  Pulmonary:     Effort: Pulmonary effort is normal.     Breath sounds: Normal breath sounds.     Comments: Evidence of gynecomastia Abdominal:     General: Abdomen is flat.     Palpations: Abdomen is soft.     Tenderness: There is no abdominal tenderness.  Genitourinary:    Comments: Chaperone present during exam.  Normal rectal tone, no obvious mass, black-colored stool on glove.  No frank bleeding. Skin:    Findings: No rash.  Neurological:     Mental Status: He is alert and oriented to person, place, and time.      ED Treatments / Results  Labs (all labs ordered are listed, but only abnormal results are displayed) Labs Reviewed  COMPREHENSIVE METABOLIC PANEL - Abnormal; Notable for the following components:      Result Value   Glucose, Bld 106 (*)    All other components within normal limits  CBC WITH DIFFERENTIAL/PLATELET  LIPASE, BLOOD  URINALYSIS, ROUTINE W REFLEX MICROSCOPIC  POC OCCULT BLOOD, ED    EKG None  Radiology No results found.  Procedures Procedures (including critical care time)  Medications Ordered in ED Medications - No data to display   Initial Impression / Assessment and Plan / ED Course  I have reviewed the triage vital signs and the nursing notes.  Pertinent labs & imaging results that were available during my care of the patient were reviewed by me and considered in my medical decision making (see chart for details).        BP (!) 118/96   Pulse 69   Temp 98.1 F (36.7 C) (Oral)   Resp 16   Ht 6' (1.829 m)   Wt 104.3 kg   SpO2 97%   BMI 31.19 kg/m    Final  Clinical Impressions(s) / ED Diagnoses   Final diagnoses:  Diarrhea, unspecified type    ED Discharge Orders    None     12:42 PM Patient here with diarrhea approximately 6 episodes since yesterday as well as noticing black tarry stool.  He admits to drinking alcohol on regular basis.  He denies NSAID use.  He does not complain of any significant pain at this time.  Work-up initiated.  2:17 PM Labs are reassuring.  Negative fecal occult blood test.  Normal H&H, normal WBC, normal electrolyte panel, normal lipase.  In the setting  of minimal abdominal discomfort, and no lab derangement, I recommend watchful waiting and return if condition worsen.  Recommend avoid alcohol abuse.   Fayrene Helperran, Sitlaly Gudiel, PA-C 11/03/18 1418    Benjiman CorePickering, Nathan, MD 11/03/18 Jerene Bears1920

## 2018-11-03 NOTE — ED Notes (Signed)
Pt provided a UA cup with instructions to pee when able

## 2018-11-03 NOTE — ED Triage Notes (Signed)
Pt BIB [facility] d/t Diarrhea for x2 days w/ dark loose watery stools
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
## 2022-12-05 ENCOUNTER — Emergency Department (HOSPITAL_COMMUNITY)
Admission: EM | Admit: 2022-12-05 | Discharge: 2022-12-05 | Discharge status: Against Medical Advice | Payer: Self-pay | Attending: Emergency Medicine | Admitting: Emergency Medicine

## 2022-12-05 ENCOUNTER — Encounter (HOSPITAL_COMMUNITY): Payer: Self-pay

## 2022-12-05 ENCOUNTER — Other Ambulatory Visit: Payer: Self-pay

## 2022-12-05 ENCOUNTER — Emergency Department (HOSPITAL_COMMUNITY): Payer: Self-pay

## 2022-12-05 DIAGNOSIS — R0602 Shortness of breath: Secondary | ICD-10-CM | POA: Insufficient documentation

## 2022-12-05 DIAGNOSIS — Z5321 Procedure and treatment not carried out due to patient leaving prior to being seen by health care provider: Secondary | ICD-10-CM | POA: Insufficient documentation

## 2022-12-05 NOTE — ED Triage Notes (Signed)
Pt said that he feels like he has shortness of breath. Diagnosed 3 weeks ago in [place] with pericarditis, but he does not trust that diagnosis. He said that it feels like his heart is pounding.

## 2023-01-05 ENCOUNTER — Ambulatory Visit (INDEPENDENT_AMBULATORY_CARE_PROVIDER_SITE_OTHER): Payer: 59 | Admitting: Primary Care

## 2023-01-05 ENCOUNTER — Encounter (INDEPENDENT_AMBULATORY_CARE_PROVIDER_SITE_OTHER): Payer: Self-pay | Admitting: Primary Care

## 2023-01-05 VITALS — BP 130/79 | HR 88 | Temp 98.0°F | Ht 72.0 in | Wt 179.0 lb

## 2023-01-05 DIAGNOSIS — Z7689 Persons encountering health services in other specified circumstances: Secondary | ICD-10-CM

## 2023-01-05 DIAGNOSIS — K089 Disorder of teeth and supporting structures, unspecified: Secondary | ICD-10-CM

## 2023-01-05 NOTE — Progress Notes (Signed)
[facility]   Subjective:   Steven Mooney is a 36 y.o. male presents for hospital follow up and establish care. He presented to the ED on  12/05/22, felt like his heart is pounding. ED did EKG and chest xrays left before treatment. Patient keeps referring to what happen or refers to time in present . Established you are out of prison done your time Time to move forward. Agreed. Patient tries to tell writer he had an infected tooth that lead to pericarditis and he needed clearance to have the tooth pulled. No records no documentation no ID explained needed past medical records . Leaving AMA from ED did not help. He appeared dehydrated eyes sunken in and lips severely chap and tongue dry. Past Medical History:  Diagnosis Date   COPD (chronic obstructive pulmonary disease) (HCC)    Gastric ulcer    Scoliosis      Allergies  Allergen Reactions   Penicillins Hives    Has patient had a PCN reaction causing immediate rash, facial/tongue/throat swelling, SOB or lightheadedness with hypotension: No Has patient had a PCN reaction causing severe rash involving mucus membranes or skin necrosis: No Has patient had a PCN reaction that required hospitalization: No Has patient had a PCN reaction occurring within the last 10 years: No If all of the above answers are "NO", then may proceed with Cephalosporin use.   Penicillins       Current Outpatient Medications on File Prior to Visit  Medication Sig Dispense Refill   ibuprofen (ADVIL) 600 MG tablet Take 600 mg by mouth every 6 (six) hours as needed for moderate pain.     omeprazole (PRILOSEC) 20 MG capsule Take 1 capsule (20 mg total) by mouth daily. 60 capsule 0   sucralfate (CARAFATE) 1 g tablet Take 1 g by mouth 4 (four) times daily -  with meals and at bedtime.     azithromycin (ZITHROMAX) 250 MG tablet Take 1 tablet (250 mg total) by mouth daily. Take first 2 tablets together, then 1 every day until finished. (Patient not taking:  Reported on 05/31/2018) 6 tablet 0   clindamycin (CLEOCIN) 300 MG capsule Take 1 capsule (300 mg total) by mouth 4 (four) times daily. X 7 days (Patient not taking: Reported on 01/05/2023) 28 capsule 0   cyclobenzaprine (FLEXERIL) 10 MG tablet Take 1 tablet (10 mg total) by mouth 3 (three) times daily as needed for muscle spasms. (Patient not taking: Reported on 05/31/2018) 20 tablet 0   docusate sodium (COLACE) 250 MG capsule Take 1 capsule (250 mg total) by mouth daily. (Patient not taking: Reported on 05/31/2018) 10 capsule 0   hydrocortisone cream 1 % Apply 1 application topically 2 (two) times daily. Do not apply to face (Patient not taking: Reported on 05/31/2018) 15 g 1   ibuprofen (ADVIL,MOTRIN) 800 MG tablet Take 1 tablet (800 mg total) by mouth every 8 (eight) hours as needed. (Patient not taking: Reported on 05/31/2018) 21 tablet 0   oxyCODONE-acetaminophen (PERCOCET/ROXICET) 5-325 MG tablet Take 2 tablets by mouth every 4 (four) hours as needed for severe pain. (Patient not taking: Reported on 05/31/2018) 10 tablet 0   polyethylene glycol (MIRALAX) packet Take 17 g by mouth daily. (Patient not taking: Reported on 05/31/2018) 14 each 0   No current facility-administered medications on file prior to visit.     Review of System: Comprehensive ROS Pertinent positive and negative noted in HPI    Objective:  Blood Pressure 130/79   Pulse  88   Temperature 98 F (36.7 C) (Oral)   Height 6' (1.829 m)   Weight 179 lb (81.2 kg)   Oxygen Saturation 99%   Body Mass Index 24.28 kg/m   Filed Weights   01/05/23 1350  Weight: 179 lb (81.2 kg)    Physical Exam: General Appearance: ill  frail in no apparent distress. Eyes: PERRLA, EOMs, conjunctiva no swelling or erythema Sinuses: No Frontal/maxillary tenderness ENT/Mouth: Ext aud canals clear, TMs without erythema, bulging. No erythema, swelling, or exudate on post pharynx.  Tonsils not swollen or erythematous. Hearing normal.  Neck: Supple, thyroid  normal.  Respiratory: Respiratory effort normal, BS equal bilaterally without rales, rhonchi, wheezing or stridor.  Cardio: RRR with no MRGs. Brisk peripheral pulses without edema.  Abdomen: Soft, + BS.  Non tender, no guarding, rebound, hernias, masses. Lymphatics: Non tender without lymphadenopathy.  Musculoskeletal: Full ROM, 5/5 strength, normal gait.  Skin: Warm, dry without rashes, lesions, ecchymosis.  Neuro: Cranial nerves intact. Normal muscle tone, no cerebellar symptoms. Sensation intact.  Psych: Awake and oriented X 3, normal affect, Insight and Judgment appropriate.    Assessment:  Arleigh was seen today for new patient (initial visit).  Diagnoses and all orders for this visit:  Encounter to establish care  Poor dentition Need records and hx before proceeding with medical clearance . Patient is aware         This note has been created with Education officer, environmental. Any transcriptional errors are unintentional.   Grayce Sessions, NP 01/05/2023, 2:09 PM

## 2023-01-05 NOTE — Patient Instructions (Signed)
Dehydration, Adult Dehydration is a condition in which there is not enough water or other fluids in the body. This happens when a person loses more fluids than they take in. Important organs cannot work right without the right amount of fluids. Any loss of fluids from the body can cause dehydration. Dehydration can be mild, worse, or very bad. It should be treated right away to keep it from getting very bad. What are the causes? Conditions that cause loss of water in the body. They include: Watery poop (diarrhea). Vomiting. Sweating a lot. Fever. Infection. Peeing (urinating) a lot. Not drinking enough fluids. Certain medicines, such as medicines that take extra fluid out of the body (diuretics). Lack of safe drinking water. Not being able to get enough water and food. What increases the risk? Having a long-term (chronic) illness that has not been treated the right way, such as: Diabetes. Heart disease. Kidney disease. Being 35 years of age or older. Having a disability. Living in a place that is high above the ground or sea (high in altitude). The thinner, drier air causes more fluid loss. Doing exercises that put stress on your body for a long time. Being active when in hot places. What are the signs or symptoms? Symptoms of dehydration depend on how bad it is. Mild or worse dehydration Thirst. Dry lips or dry mouth. Feeling dizzy or light-headed. Muscle cramps. Passing little pee or dark pee. Pee may be the color of tea. Headache. Very bad dehydration Changes in skin. Skin may: Be cold to the touch (clammy). Be blotchy or pale. Not go back to normal right after you pinch it and let it go. Little or no tears, pee, or sweat. Fast breathing. Low blood pressure. Weak pulse. Pulse that is more than 100 beats a minute when you are sitting still. Other changes, such as: Feeling very thirsty. Eyes that look hollow (sunken). Cold hands and feet. Being confused. Being very  tired (lethargic) or having trouble waking from sleep. Losing weight. Loss of consciousness. How is this treated? Treatment for this condition depends on how bad your dehydration is. Treatment should start right away. Do not wait until your condition gets very bad. Very bad dehydration is an emergency. You will need to go to a hospital. Mild or worse dehydration can be treated at home. You may be asked to: Drink more fluids. Drink an oral rehydration solution (ORS). This drink gives you the right amount of fluids, salts, and minerals (electrolytes). Very bad dehydration can be treated: With fluids through an IV tube. By correcting low levels of electrolytes in the body. By treating the problem that caused your dehydration. Follow these instructions at home: Oral rehydration solution If told by your doctor, drink an ORS: Make an ORS. Use instructions on the package. Start by drinking small amounts, about  cup (120 mL) every 5-10 minutes. Slowly drink more until you have had the amount that your doctor said to have.  Eating and drinking  Drink enough clear fluid to keep your pee pale yellow. If you were told to drink an ORS, finish the ORS first. Then, start slowly drinking other clear fluids. Drink fluids such as: Water. Do not drink only water. Doing that can make the salt (sodium) level in your body get too low. Water from ice chips you suck on. Fruit juice that you have added water to (diluted). Low-calorie sports drinks. Eat foods that have the right amounts of salts and minerals, such as bananas, oranges, potatoes,  tomatoes, or spinach. Do not drink alcohol. Avoid drinks that have caffeine or sugar. These include:: High-calorie sports drinks. Fruit juice that you did not add water to. Soda. Coffee or energy drinks. Avoid foods that are greasy or have a lot of fat or sugar. General instructions Take over-the-counter and prescription medicines only as told by your doctor. Do  not take sodium tablets. Doing that can make the salt level in your body get too high. Return to your normal activities as told by your doctor. Ask your doctor what activities are safe for you. Keep all follow-up visits. Your doctor may check and change your treatment. Contact a doctor if: You have pain in your belly (abdomen) and the pain: Gets worse. Stays in one place. You have a rash. You have a stiff neck. You get angry or annoyed more easily than normal. You are more tired or have a harder time waking than normal. You feel weak or dizzy. You feel very thirsty. Get help right away if: You have any symptoms of very bad dehydration. You vomit every time you eat or drink. Your vomiting gets worse, does not go away, or you vomit blood or green stuff. You are getting treatment, but symptoms are getting worse. You have a fever. You have a very bad headache. You have: Diarrhea that gets worse or does not go away. Blood in your poop (stool). This may cause poop to look black and tarry. No pee in 6-8 hours. Only a small amount of pee in 6-8 hours, and the pee is very dark. You have trouble breathing. These symptoms may be an emergency. Get help right away. Call 911. Do not wait to see if the symptoms will go away. Do not drive yourself to the hospital. This information is not intended to replace advice given to you by your health care provider. Make sure you discuss any questions you have with your health care provider. Document Revised: 12/13/2021 Document Reviewed: 12/13/2021 Elsevier Patient Education  2024 [employer] Preventing Hyperthermia Hyperthermia develops when the body is overheated and does not cool down properly. It is a type of heat illness that usually happens when outside in hot weather, or in hot environments, for a long time. Hyperthermia can lead to conditions such as heat stroke, which is a medical emergency and can be life-threatening. How can hyperthermia  affect me? Hyperthermia can cause different types of heat-related illnesses and symptoms to develop. These include: Heat rash. This red, blotchy skin rash on the neck, arms, and torso is also called prickly heat. Muscle cramps. Heat exhaustion. This is when your body cannot sweat enough to cool itself down. You may feel thirsty, dizzy, tired, and have a headache. Heat stroke. This is when you have a body temperature of 104F (40C) or higher and your body cannot cool itself properly. Symptoms include rapid heartbeat and breathing, confusion, nausea, and seizures. Heat stroke is an emergency that can cause organ damage and even death. What can increase my risk? People who work or exercise outside in hot weather have a higher risk of heat-related illnesses and hyperthermia. You may also be at higher risk if you: Are over age 56 or are very young. Live alone, especially in a home without air conditioning or proper ventilation. Are very overweight (obese). Have certain chronic medical conditions, such as heart disease, poor circulation or other vascular diseases, sickle cell disease, high blood pressure, diabetes, lung disease, or cystic fibrosis, or you have had a stroke. Take certain  medicines for high blood pressure, or you take antihistamines or tranquilizers. Drink too much alcohol or use drugs, such as cocaine, heroin, or amphetamines. What actions can I take to prevent hyperthermia? Nutrition  Drink enough water or sports drinks with electrolytes to keep your urine pale yellow. When it is hot, drink every 15-20 minutes, even if you are not thirsty. Drink fluids ahead of time if you know you will be in a hot environment. Sports drinks with electrolytes are best if doing strenuous activity or playing sports in hot weather. Avoid drinks that contain caffeine or alcohol when it is very hot outside. Activity Check with your health care provider before starting any new exercise or activity. Ask  about any health conditions or medicines that might increase your risk for heat-related illnesses. Do not work or exercise in the heat when you feel unwell or have been sick. Give your body a chance to gradually adapt to the heat over several days. This will help to prevent illness and cramping. Wear lightweight, breathable clothes that fit loosely and are light in color. Lifestyle Avoid being outside on very hot days. Wear light-colored, loose-fitting breathable clothing. When outside in high heat, take frequent breaks in shaded areas or go indoors where there is air conditioning or good ventilation. Protect yourself from the sun by wearing a broad-brimmed hat and using a broad-spectrum sunscreen that is SPF 15 or higher. General information Do not stay for long periods of time in cars without air conditioning. Never leave infants, children, or older adults alone in a car in hot weather. Contact a health care provider if you: Feel weak or dizzy. Faint. Have symptoms that may include muscle cramps, fatigue, redness of the face and neck, and headache. Get help right away if you: Have signs of heat stroke, including: Body temperature of 104F (40C) or higher. Hot, dry, red skin. Fast, thumping pulse. Severe nausea, vomiting, or both. Confusion. These symptoms may represent a serious problem that is an emergency. Do not wait to see if the symptoms will go away. Get medical help right away. Call your local emergency services (911 in the [place] Do not drive yourself to the hospital. Summary Hyperthermia develops when the body is overheated and does not cool down properly. It is a type of heat illness that usually happens when outside in hot weather, or in hot environments, for a long time. Plan ahead to avoid heat illnesses. Avoid strenuous activities during the hottest times of the day. When outside in high heat, take frequent breaks in shaded areas or go indoors where there is air  conditioning or good ventilation. When it is hot, drink every 15-20 minutes, even if you are not thirsty. Seek immediate medical care if you have symptoms of heat stroke, such as rapid heartbeat and breathing, confusion, nausea, and seizures. Heat stroke is an emergency that can cause organ damage and even death. This information is not intended to replace advice given to you by your health care provider. Make sure you discuss any questions you have with your health care provider. Document Revised: 11/11/2019 Document Reviewed: 11/11/2019 Elsevier Patient Education  2024 [employer].

## 2023-03-30 ENCOUNTER — Emergency Department (HOSPITAL_COMMUNITY): Payer: 59

## 2023-03-30 ENCOUNTER — Other Ambulatory Visit: Payer: Self-pay

## 2023-03-30 ENCOUNTER — Encounter (HOSPITAL_COMMUNITY): Payer: Self-pay

## 2023-03-30 ENCOUNTER — Emergency Department (HOSPITAL_COMMUNITY)
Admission: EM | Admit: 2023-03-30 | Discharge: 2023-03-30 | Discharge status: Against Medical Advice | Payer: 59 | Attending: Emergency Medicine | Admitting: Emergency Medicine

## 2023-03-30 DIAGNOSIS — R002 Palpitations: Secondary | ICD-10-CM | POA: Insufficient documentation

## 2023-03-30 DIAGNOSIS — R079 Chest pain, unspecified: Secondary | ICD-10-CM | POA: Diagnosis not present

## 2023-03-30 DIAGNOSIS — Z5321 Procedure and treatment not carried out due to patient leaving prior to being seen by health care provider: Secondary | ICD-10-CM | POA: Insufficient documentation

## 2023-03-30 LAB — BASIC METABOLIC PANEL
Anion gap: 14 (ref 5–15)
BUN: 8 mg/dL (ref 6–20)
CO2: 23 mmol/L (ref 22–32)
Calcium: 10 mg/dL (ref 8.9–10.3)
Chloride: 98 mmol/L (ref 98–111)
Creatinine, Ser: 1.33 mg/dL — ABNORMAL HIGH (ref 0.61–1.24)
GFR, Estimated: >60 mL/min (ref >60)
Glucose, Bld: 91 mg/dL (ref 70–99)
Potassium: 3.8 mmol/L (ref 3.5–5.1)
Sodium: 135 mmol/L (ref 135–145)

## 2023-03-30 LAB — TROPONIN I (HIGH SENSITIVITY): Troponin I (High Sensitivity): 3 ng/L (ref <18)

## 2023-03-30 LAB — CBC
HCT: 44.4 % (ref 39.0–52.0)
Hemoglobin: 15.1 g/dL (ref 13.0–17.0)
MCH: 30.3 pg (ref 26.0–34.0)
MCHC: 34 g/dL (ref 30.0–36.0)
MCV: 89 fL (ref 80.0–100.0)
Platelets: 300 10*3/uL (ref 150–400)
RBC: 4.99 MIL/uL (ref 4.22–5.81)
RDW: 13.2 % (ref 11.5–15.5)
WBC: 7.7 10*3/uL (ref 4.0–10.5)
nRBC: 0 % (ref 0.0–0.2)

## 2023-03-30 NOTE — ED Notes (Addendum)
Called for trop no answer

## 2023-03-30 NOTE — ED Notes (Signed)
Pt was called x3, no answer and pt was taken OTF.
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
## 2023-03-30 NOTE — ED Notes (Signed)
Pt called to esign MSE form, no answer

## 2023-06-15 ENCOUNTER — Telehealth (INDEPENDENT_AMBULATORY_CARE_PROVIDER_SITE_OTHER): Payer: Self-pay

## 2023-06-15 NOTE — Telephone Encounter (Signed)
Copied from CRM [id number]. Topic: Medical Record Request - Other >> Jun 15, 2023  9:00 AM Clide Dales wrote: Patient states that he filled out paperwork a few month ago to have his records sent from [place]. Patient would like an update. Not showing records in chart.

## 2023-07-30 ENCOUNTER — Emergency Department (HOSPITAL_COMMUNITY): Payer: Self-pay

## 2023-07-30 ENCOUNTER — Encounter (HOSPITAL_COMMUNITY): Payer: Self-pay

## 2023-07-30 ENCOUNTER — Other Ambulatory Visit: Payer: Self-pay

## 2023-07-30 ENCOUNTER — Emergency Department (HOSPITAL_COMMUNITY)
Admission: EM | Admit: 2023-07-30 | Discharge: 2023-07-30 | Disposition: A | Discharge status: Home / Self Care | Payer: Self-pay | Attending: Emergency Medicine | Admitting: Emergency Medicine

## 2023-07-30 DIAGNOSIS — F419 Anxiety disorder, unspecified: Secondary | ICD-10-CM | POA: Insufficient documentation

## 2023-07-30 LAB — CBC WITH DIFFERENTIAL/PLATELET
Abs Immature Granulocytes: 0.03 10*3/uL (ref 0.00–0.07)
Basophils Absolute: 0.1 10*3/uL (ref 0.0–0.1)
Basophils Relative: 1 %
Eosinophils Absolute: 0.1 10*3/uL (ref 0.0–0.5)
Eosinophils Relative: 1 %
HCT: 43.4 % (ref 39.0–52.0)
Hemoglobin: 15.2 g/dL (ref 13.0–17.0)
Immature Granulocytes: 0 %
Lymphocytes Relative: 17 %
Lymphs Abs: 1.8 10*3/uL (ref 0.7–4.0)
MCH: 30.9 pg (ref 26.0–34.0)
MCHC: 35 g/dL (ref 30.0–36.0)
MCV: 88.2 fL (ref 80.0–100.0)
Monocytes Absolute: 0.5 10*3/uL (ref 0.1–1.0)
Monocytes Relative: 5 %
Neutro Abs: 8 10*3/uL — ABNORMAL HIGH (ref 1.7–7.7)
Neutrophils Relative %: 76 %
Platelets: 337 10*3/uL (ref 150–400)
RBC: 4.92 MIL/uL (ref 4.22–5.81)
RDW: 13.3 % (ref 11.5–15.5)
WBC: 10.6 10*3/uL — ABNORMAL HIGH (ref 4.0–10.5)
nRBC: 0 % (ref 0.0–0.2)

## 2023-07-30 LAB — COMPREHENSIVE METABOLIC PANEL
ALT: 26 U/L (ref 0–44)
AST: 36 U/L (ref 15–41)
Albumin: 4.8 g/dL (ref 3.5–5.0)
Alkaline Phosphatase: 64 U/L (ref 38–126)
Anion gap: 19 — ABNORMAL HIGH (ref 5–15)
BUN: 9 mg/dL (ref 6–20)
CO2: 19 mmol/L — ABNORMAL LOW (ref 22–32)
Calcium: 9.8 mg/dL (ref 8.9–10.3)
Chloride: 101 mmol/L (ref 98–111)
Creatinine, Ser: 1.18 mg/dL (ref 0.61–1.24)
GFR, Estimated: >60 mL/min (ref >60)
Glucose, Bld: 91 mg/dL (ref 70–99)
Potassium: 3.9 mmol/L (ref 3.5–5.1)
Sodium: 139 mmol/L (ref 135–145)
Total Bilirubin: 1.6 mg/dL — ABNORMAL HIGH (ref 0.0–1.2)
Total Protein: 8.1 g/dL (ref 6.5–8.1)

## 2023-07-30 LAB — ETHANOL: Alcohol, Ethyl (B): 21 mg/dL — ABNORMAL HIGH (ref <10)

## 2023-07-30 LAB — LIPASE, BLOOD: Lipase: 30 U/L (ref 11–51)

## 2023-07-30 MED ORDER — ONDANSETRON HCL 4 MG PO TABS: 4.0000 mg | ORAL_TABLET | Freq: Three times a day (TID) | ORAL | Status: DC | PRN

## 2023-07-30 MED ORDER — SODIUM CHLORIDE 0.9 % IV BOLUS
1000.0000 mL | Freq: Once | INTRAVENOUS | Status: AC
  Administered 2023-07-30: 1000 mL via INTRAVENOUS

## 2023-07-30 MED ORDER — LORAZEPAM 1 MG PO TABS: 1.0000 mg | ORAL_TABLET | Freq: Once | ORAL | Status: DC

## 2023-07-30 MED ORDER — HYDROXYZINE HCL 25 MG PO TABS
25.0000 mg | ORAL_TABLET | Freq: Four times a day (QID) | ORAL | 0 refills | Status: DC | PRN
Start: 2023-07-30 — End: 2024-02-15

## 2023-07-30 MED ORDER — NICOTINE 21 MG/24HR TD PT24: 21.0000 mg | MEDICATED_PATCH | Freq: Every day | TRANSDERMAL | Status: DC

## 2023-07-30 MED ORDER — LORAZEPAM 2 MG/ML IJ SOLN
1.0000 mg | Freq: Once | INTRAMUSCULAR | Status: AC
  Administered 2023-07-30: 1 mg via INTRAVENOUS
  Filled 2023-07-30: qty 1

## 2023-07-30 MED ORDER — ACETAMINOPHEN 325 MG PO TABS: 650.0000 mg | ORAL_TABLET | ORAL | Status: DC | PRN

## 2023-07-30 MED ORDER — HYDROXYZINE HCL 25 MG PO TABS: 25.0000 mg | ORAL_TABLET | Freq: Four times a day (QID) | ORAL | Status: DC | PRN
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
## 2023-07-30 NOTE — ED Notes (Signed)
 Patient transported to X-ray

## 2023-08-31 ENCOUNTER — Emergency Department (HOSPITAL_COMMUNITY)
Admission: EM | Admit: 2023-08-31 | Discharge: 2023-08-31 | Disposition: A | Discharge status: Home / Self Care | Payer: Self-pay | Attending: Emergency Medicine | Admitting: Emergency Medicine

## 2023-08-31 DIAGNOSIS — Y904 Blood alcohol level of 80-99 mg/100 ml: Secondary | ICD-10-CM | POA: Insufficient documentation

## 2023-08-31 DIAGNOSIS — F1010 Alcohol abuse, uncomplicated: Secondary | ICD-10-CM | POA: Insufficient documentation

## 2023-08-31 DIAGNOSIS — R112 Nausea with vomiting, unspecified: Secondary | ICD-10-CM | POA: Insufficient documentation

## 2023-08-31 DIAGNOSIS — R1010 Upper abdominal pain, unspecified: Secondary | ICD-10-CM | POA: Insufficient documentation

## 2023-08-31 LAB — COMPREHENSIVE METABOLIC PANEL WITH GFR
ALT: 61 U/L — ABNORMAL HIGH (ref 0–44)
AST: 65 U/L — ABNORMAL HIGH (ref 15–41)
Albumin: 4.5 g/dL (ref 3.5–5.0)
Alkaline Phosphatase: 56 U/L (ref 38–126)
Anion gap: 19 — ABNORMAL HIGH (ref 5–15)
BUN: 9 mg/dL (ref 6–20)
CO2: 20 mmol/L — ABNORMAL LOW (ref 22–32)
Calcium: 9.6 mg/dL (ref 8.9–10.3)
Chloride: 101 mmol/L (ref 98–111)
Creatinine, Ser: 1.17 mg/dL (ref 0.61–1.24)
GFR, Estimated: >60 mL/min (ref >60)
Glucose, Bld: 74 mg/dL (ref 70–99)
Potassium: 3.9 mmol/L (ref 3.5–5.1)
Sodium: 140 mmol/L (ref 135–145)
Total Bilirubin: 1.8 mg/dL — ABNORMAL HIGH (ref 0.0–1.2)
Total Protein: 7.5 g/dL (ref 6.5–8.1)

## 2023-08-31 LAB — ETHANOL: Alcohol, Ethyl (B): 82 mg/dL — ABNORMAL HIGH (ref <10)

## 2023-08-31 LAB — CBC WITH DIFFERENTIAL/PLATELET
Abs Immature Granulocytes: 0.03 10*3/uL (ref 0.00–0.07)
Basophils Absolute: 0.1 10*3/uL (ref 0.0–0.1)
Basophils Relative: 1 %
Eosinophils Absolute: 0.3 10*3/uL (ref 0.0–0.5)
Eosinophils Relative: 4 %
HCT: 43.6 % (ref 39.0–52.0)
Hemoglobin: 14.8 g/dL (ref 13.0–17.0)
Immature Granulocytes: 0 %
Lymphocytes Relative: 24 %
Lymphs Abs: 2 10*3/uL (ref 0.7–4.0)
MCH: 31 pg (ref 26.0–34.0)
MCHC: 33.9 g/dL (ref 30.0–36.0)
MCV: 91.4 fL (ref 80.0–100.0)
Monocytes Absolute: 0.6 10*3/uL (ref 0.1–1.0)
Monocytes Relative: 7 %
Neutro Abs: 5.4 10*3/uL (ref 1.7–7.7)
Neutrophils Relative %: 64 %
Platelets: 320 10*3/uL (ref 150–400)
RBC: 4.77 MIL/uL (ref 4.22–5.81)
RDW: 13.7 % (ref 11.5–15.5)
WBC: 8.3 10*3/uL (ref 4.0–10.5)
nRBC: 0 % (ref 0.0–0.2)

## 2023-08-31 LAB — LIPASE, BLOOD: Lipase: 38 U/L (ref 11–51)

## 2023-08-31 MED ORDER — ONDANSETRON 4 MG PO TBDP: 4.0000 mg | ORAL_TABLET | Freq: Three times a day (TID) | ORAL | 0 refills | Status: DC | PRN

## 2023-08-31 MED ORDER — SODIUM CHLORIDE 0.9 % IV BOLUS
1000.0000 mL | Freq: Once | INTRAVENOUS | Status: AC
  Administered 2023-08-31: 1000 mL via INTRAVENOUS

## 2023-08-31 MED ORDER — OMEPRAZOLE 20 MG PO CPDR: 20.0000 mg | DELAYED_RELEASE_CAPSULE | Freq: Every day | ORAL | 0 refills | Status: DC

## 2023-08-31 MED ORDER — LORAZEPAM 2 MG/ML IJ SOLN: 1.0000 mg | Freq: Once | INTRAMUSCULAR | Status: DC

## 2023-08-31 MED ORDER — ONDANSETRON HCL 4 MG/2ML IJ SOLN
4.0000 mg | Freq: Once | INTRAMUSCULAR | Status: AC
  Administered 2023-08-31: 4 mg via INTRAVENOUS
  Filled 2023-08-31: qty 2
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
## 2023-08-31 NOTE — ED Notes (Signed)
 MD notified of patient's statement "I feel like my heart is going to pop out of my chest"

## 2023-08-31 NOTE — ED Notes (Signed)
 MD notified of CIWA score, orders to monitor for now and not give Ativan at this time.

## 2023-08-31 NOTE — ED Notes (Signed)
 Patient was given a sprite with cup of ice.
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
## 2023-12-04 ENCOUNTER — Emergency Department (HOSPITAL_COMMUNITY)
Admission: EM | Admit: 2023-12-04 | Discharge: 2023-12-04 | Discharge status: Against Medical Advice | Payer: Self-pay | Attending: Emergency Medicine | Admitting: Emergency Medicine

## 2023-12-04 ENCOUNTER — Other Ambulatory Visit: Payer: Self-pay

## 2023-12-04 DIAGNOSIS — R0789 Other chest pain: Secondary | ICD-10-CM | POA: Insufficient documentation

## 2023-12-04 DIAGNOSIS — F410 Panic disorder [episodic paroxysmal anxiety] without agoraphobia: Secondary | ICD-10-CM | POA: Insufficient documentation

## 2023-12-04 DIAGNOSIS — Z5321 Procedure and treatment not carried out due to patient leaving prior to being seen by health care provider: Secondary | ICD-10-CM | POA: Insufficient documentation

## 2023-12-04 NOTE — ED Notes (Signed)
 Patient has been called many times and also have looked outside for patient as well

## 2023-12-04 NOTE — ED Notes (Signed)
 Patient was called back to a room    Patient is no longer sitting outside where   triage nurse was advised

## 2023-12-04 NOTE — ED Triage Notes (Signed)
 Patient to ED by EMS from home with c/o Panic attack. EMS states he has HX of anxiety and panic attacks has prescribed meds but can not afford them. With panic attack he has crying spells and chest discomfort.

## 2024-02-07 ENCOUNTER — Emergency Department (HOSPITAL_COMMUNITY)
Admission: EM | Admit: 2024-02-07 | Discharge: 2024-02-07 | Disposition: A | Discharge status: Home / Self Care | Payer: Self-pay | Attending: Emergency Medicine | Admitting: Emergency Medicine

## 2024-02-07 ENCOUNTER — Encounter (HOSPITAL_COMMUNITY): Payer: Self-pay | Admitting: Emergency Medicine

## 2024-02-07 ENCOUNTER — Other Ambulatory Visit: Payer: Self-pay

## 2024-02-07 ENCOUNTER — Emergency Department (HOSPITAL_COMMUNITY): Payer: Self-pay

## 2024-02-07 DIAGNOSIS — R079 Chest pain, unspecified: Secondary | ICD-10-CM | POA: Insufficient documentation

## 2024-02-07 DIAGNOSIS — R1031 Right lower quadrant pain: Secondary | ICD-10-CM | POA: Insufficient documentation

## 2024-02-07 DIAGNOSIS — J449 Chronic obstructive pulmonary disease, unspecified: Secondary | ICD-10-CM | POA: Insufficient documentation

## 2024-02-07 DIAGNOSIS — R1013 Epigastric pain: Secondary | ICD-10-CM | POA: Insufficient documentation

## 2024-02-07 DIAGNOSIS — R112 Nausea with vomiting, unspecified: Secondary | ICD-10-CM | POA: Insufficient documentation

## 2024-02-07 LAB — COMPREHENSIVE METABOLIC PANEL WITH GFR
ALT: 94 U/L — ABNORMAL HIGH (ref 0–44)
AST: 78 U/L — ABNORMAL HIGH (ref 15–41)
Albumin: 4.7 g/dL (ref 3.5–5.0)
Alkaline Phosphatase: 63 U/L (ref 38–126)
Anion gap: 20 — ABNORMAL HIGH (ref 5–15)
BUN: 8 mg/dL (ref 6–20)
CO2: 20 mmol/L — ABNORMAL LOW (ref 22–32)
Calcium: 10.2 mg/dL (ref 8.9–10.3)
Chloride: 98 mmol/L (ref 98–111)
Creatinine, Ser: 1.3 mg/dL — ABNORMAL HIGH (ref 0.61–1.24)
GFR, Estimated: >60 mL/min
Glucose, Bld: 97 mg/dL (ref 70–99)
Potassium: 4 mmol/L (ref 3.5–5.1)
Sodium: 138 mmol/L (ref 135–145)
Total Bilirubin: 2.1 mg/dL — ABNORMAL HIGH (ref 0.0–1.2)
Total Protein: 8.1 g/dL (ref 6.5–8.1)

## 2024-02-07 LAB — CBC WITH DIFFERENTIAL/PLATELET
Abs Immature Granulocytes: 0.02 K/uL (ref 0.00–0.07)
Basophils Absolute: 0.1 K/uL (ref 0.0–0.1)
Basophils Relative: 1 %
Eosinophils Absolute: 0.1 K/uL (ref 0.0–0.5)
Eosinophils Relative: 2 %
HCT: 45.7 % (ref 39.0–52.0)
Hemoglobin: 15.7 g/dL (ref 13.0–17.0)
Immature Granulocytes: 0 %
Lymphocytes Relative: 27 %
Lymphs Abs: 1.7 K/uL (ref 0.7–4.0)
MCH: 30.8 pg (ref 26.0–34.0)
MCHC: 34.4 g/dL (ref 30.0–36.0)
MCV: 89.6 fL (ref 80.0–100.0)
Monocytes Absolute: 0.6 K/uL (ref 0.1–1.0)
Monocytes Relative: 9 %
Neutro Abs: 3.8 K/uL (ref 1.7–7.7)
Neutrophils Relative %: 61 %
Platelets: 277 K/uL (ref 150–400)
RBC: 5.1 MIL/uL (ref 4.22–5.81)
RDW: 13.3 % (ref 11.5–15.5)
WBC: 6.3 K/uL (ref 4.0–10.5)
nRBC: 0 % (ref 0.0–0.2)

## 2024-02-07 LAB — URINALYSIS, ROUTINE W REFLEX MICROSCOPIC
Bilirubin Urine: NEGATIVE
Glucose, UA: NEGATIVE mg/dL
Ketones, ur: 20 mg/dL — AB
Leukocytes,Ua: NEGATIVE
Nitrite: NEGATIVE
Protein, ur: 30 mg/dL — AB
Specific Gravity, Urine: >1.046 — ABNORMAL HIGH (ref 1.005–1.030)
pH: 7 (ref 5.0–8.0)

## 2024-02-07 LAB — LIPASE, BLOOD: Lipase: 70 U/L — ABNORMAL HIGH (ref 11–51)

## 2024-02-07 LAB — ETHANOL: Alcohol, Ethyl (B): <15 mg/dL (ref <15)

## 2024-02-07 LAB — TROPONIN I (HIGH SENSITIVITY)
Troponin I (High Sensitivity): 4 ng/L (ref <18)
Troponin I (High Sensitivity): 4 ng/L (ref <18)

## 2024-02-07 MED ORDER — OXYCODONE HCL 5 MG PO TABS
5.0000 mg | ORAL_TABLET | Freq: Once | ORAL | Status: AC
  Administered 2024-02-07: 5 mg via ORAL
  Filled 2024-02-07: qty 1

## 2024-02-07 MED ORDER — PANTOPRAZOLE SODIUM 20 MG PO TBEC: 20.0000 mg | DELAYED_RELEASE_TABLET | Freq: Every day | ORAL | 0 refills | Status: DC

## 2024-02-07 MED ORDER — THIAMINE HCL 100 MG/ML IJ SOLN
100.0000 mg | Freq: Every day | INTRAMUSCULAR | Status: DC
  Filled 2024-02-07: qty 2

## 2024-02-07 MED ORDER — IOHEXOL 350 MG/ML SOLN
75.0000 mL | Freq: Once | INTRAVENOUS | Status: AC | PRN
  Administered 2024-02-07: 75 mL via INTRAVENOUS

## 2024-02-07 MED ORDER — FOLIC ACID 1 MG PO TABS
1.0000 mg | ORAL_TABLET | Freq: Every day | ORAL | Status: DC
  Administered 2024-02-07: 1 mg via ORAL
  Filled 2024-02-07: qty 1

## 2024-02-07 MED ORDER — LORAZEPAM 2 MG/ML IJ SOLN
1.0000 mg | INTRAMUSCULAR | Status: DC | PRN
  Administered 2024-02-07: 1 mg via INTRAVENOUS
  Filled 2024-02-07: qty 1

## 2024-02-07 MED ORDER — ADULT MULTIVITAMIN W/MINERALS CH
1.0000 | ORAL_TABLET | Freq: Every day | ORAL | Status: DC
  Administered 2024-02-07: 1 via ORAL
  Filled 2024-02-07: qty 1

## 2024-02-07 MED ORDER — THIAMINE MONONITRATE 100 MG PO TABS
100.0000 mg | ORAL_TABLET | Freq: Every day | ORAL | Status: DC
  Administered 2024-02-07: 100 mg via ORAL
  Filled 2024-02-07: qty 1

## 2024-02-07 MED ORDER — ONDANSETRON HCL 4 MG/2ML IJ SOLN
4.0000 mg | Freq: Once | INTRAMUSCULAR | Status: AC
  Administered 2024-02-07: 4 mg via INTRAVENOUS
  Filled 2024-02-07: qty 2

## 2024-02-07 MED ORDER — ONDANSETRON 4 MG PO TBDP: 4.0000 mg | ORAL_TABLET | Freq: Three times a day (TID) | ORAL | 0 refills | Status: DC | PRN

## 2024-02-07 MED ORDER — SODIUM CHLORIDE 0.9 % IV BOLUS
1000.0000 mL | Freq: Once | INTRAVENOUS | Status: AC
  Administered 2024-02-07: 1000 mL via INTRAVENOUS

## 2024-02-07 MED ORDER — LORAZEPAM 1 MG PO TABS: 1.0000 mg | ORAL_TABLET | ORAL | Status: DC | PRN

## 2024-02-07 NOTE — Discharge Instructions (Addendum)
 Take Zofran  as needed for nausea vomiting.  Take Protonix  daily.  Make sure you are staying well-hydrated with water you can try bland foods.  Follow-up with cardiology.  Return to ER with new or worsening symptoms. Slowly cut back on alcohol intake.                                      Outpatient Substance Abuse  Treatment- uninsured  Narcotics Anonymous 24-HOUR HELPLINE Pre-recorded for Meeting Schedules [facility] [phone]  [email or web address] ALCOHOLICS ANONYMOUS  [place]   Answering Service [phone] Please Note: All [place] Meetings are Non-smoking [email or web address]  Alcohol and Drug Services -  Insurance: Medicaid /State funding/private insurance Methadone, suboxone/Intensive outpatient  [place]   [phone] Fax: [phone] [address] [place], [place], [address] [place] [phone] Fax: [phone]    [address] [place], [place], [address] ([address] [place], [place], [place], [place], [place], [place], [place], [place]) [facility] [email or web address] Accepts State funding/Medicaid Transitional housing, Intensive Outpatient Treatment, Outpatient treatment, Veterans Services  Phone: [phone] Fax: [phone] Address: [address] [place] [address]   [facility] [employer] [facility] ([email or web address]/) Insurance: Medicaid Case Management, Administrator, arts, Medication Management, Outpatient Therapy, Psychosocial Rehabilitation, Substance Abuse Intensive Outpatient  Phone: [phone] Fax: [phone] [address], [place], [place], [address]  [facility] Medicaid, most private insurance providers Types of Program: Individual/Group Therapy, Substance Abuse Treatment  Phone: [place] [phone] Fax: [phone] [address], [place], [place], [address] [place] [phone] [address], [address] [place], [place], [address]  [facility]  Medicaid Types of Program: SAIOP  Phone: [phone] Fax: [phone] [address] [place], [place], [place], [address] [facility] Medicaid/state funds Crisis line [phone] [place] [facility] [phone] [place] [phone] [place] [phone]  [facility] [address];  [place], [place] [address] [phone]  Substance Abuse Intensive Outpatient Program [facility] [address] [place], [place] [address] [phone]- Substance abuse treatment  [facility]  Insurance: [facility], [address], sliding scale Types of Program: substance abuse treatment, transportation assistance Phone: [phone] Fax: [phone] Address: [address], [place] [address] [facility] ([email or web address]) Insurance: UHC, [place], [place], [place] [employer] Program: addiction counseling, detoxification,  Phone: [phone]  Fax: [phone] Address: [address] [place], [place] [address]  [facility] (statewide facilities/programs) [address] (Medicaid/state funds) [place], [place] [address]                      [email or web address] [phone] [place] mcalpine- [phone] [place]- [phone] [facility] [place] (2 Locations) (Medicaid/state funds) --[address]  walk in 8:30-12 and 1-2:30 [place], [id number]   [facility]- [phone] --[address] [place], [place] [address]  [phone] walk in 8:30-12 and 2-3:30  [facility] state funds/medicaid [address] [place], [place] [address] [phone] [facility] (Suboxone clinic) Medicaid/state funds  [address] [place], [place] [address] [phone]   [facility]  [address] [place], [place] [address]  [phone] (24 hours) [place]- [address] [place], [place] [address]  [phone] (24 hours) [place]- [address] [place]  [phone] [place]- [address] [place] [phone] [place]-  [address] [place] [phone] [facility]- Medicaid and state funds  [place]- [address] [place] [address] [phone] (24 hours) [place]- [address] [place], [place] [address] [phone] [facility]- [address] [place], [place] [address] [phone] (24 hours) [place] [address] [place], [place]  [address] [phone] [place]- [address] [place] [phone]

## 2024-02-07 NOTE — ED Provider Notes (Signed)
 [facility] Provider Note   CSN: [id number] Arrival date & time: 02/07/24  9377     Patient presents with: Abdominal Pain and Emesis   Steven Mooney is a 38 y.o. male with past medical history of pancreatitis, COPD, gastric ulcer, anxiety and ETOH abuse is presenting to ER with complaint of abdominal pain worse on the right side and epigastric area, NV, shaking and anxiety. Patient reporting over 1 year of drinking daily 1-2 beers a day. Reports has not been able to keep anything down for 4 days, last drink 4 days ago. Reports night terrors are keeping him up. Reports he has not had EtOH seizure or need for admit before. Feeling better after Zofran  in triage.     Abdominal Pain Associated symptoms: vomiting   Emesis Associated symptoms: abdominal pain        Prior to Admission medications   Medication Sig Start Date End Date Taking? Authorizing Provider  hydrOXYzine  (ATARAX ) 25 MG tablet Take 1 tablet (25 mg total) by mouth every 6 (six) hours as needed for anxiety. 07/30/23   Dean Clarity, MD  ibuprofen  (ADVIL ) 600 MG tablet Take 600 mg by mouth every 6 (six) hours as needed for moderate pain.    [provider]  omeprazole  (PRILOSEC) 20 MG capsule Take 1 capsule (20 mg total) by mouth daily. 08/31/23   Patsey Lot, MD  ondansetron  (ZOFRAN -ODT) 4 MG disintegrating tablet Take 1 tablet (4 mg total) by mouth every 8 (eight) hours as needed. 08/31/23   Patsey Lot, MD  sucralfate (CARAFATE) 1 g tablet Take 1 g by mouth 4 (four) times daily -  with meals and at bedtime.    [provider]    Allergies: Penicillins and Penicillins    Review of Systems  Gastrointestinal:  Positive for abdominal pain and vomiting.    Updated Vital Signs BP (!) 119/94   Pulse (!) 58   Temp 98.2 F (36.8 C) (Oral)   Resp (!) 22   Wt 83 kg   SpO2 100%   BMI 24.82 kg/m   Physical Exam Vitals and nursing note reviewed.   Constitutional:      General: He is not in acute distress.    Appearance: He is not toxic-appearing.  HENT:     Head: Normocephalic and atraumatic.  Eyes:     General: No scleral icterus.    Conjunctiva/sclera: Conjunctivae normal.  Cardiovascular:     Rate and Rhythm: Normal rate and regular rhythm.     Pulses: Normal pulses.     Heart sounds: Normal heart sounds.     Comments: Patient with no sign of severe withdrawal, no hallucinations, no tremor. No tachycardia.  Pulmonary:     Effort: Pulmonary effort is normal. No respiratory distress.     Breath sounds: Normal breath sounds.  Abdominal:     General: Abdomen is flat. Bowel sounds are normal.     Palpations: Abdomen is soft.     Tenderness: There is abdominal tenderness in the right lower quadrant and epigastric area.  Genitourinary:    Testes:        Right: Swelling not present.        Left: Swelling not present.  Skin:    General: Skin is warm and dry.     Findings: No lesion.  Neurological:     General: No focal deficit present.     Mental Status: He is alert and oriented to person, place, and  time. Mental status is at baseline.     (all labs ordered are listed, but only abnormal results are displayed) Labs Reviewed  COMPREHENSIVE METABOLIC PANEL WITH GFR - Abnormal; Notable for the following components:      Result Value   CO2 20 (*)    Creatinine, Ser 1.30 (*)    AST 78 (*)    ALT 94 (*)    Total Bilirubin 2.1 (*)    Anion gap 20 (*)    All other components within normal limits  URINALYSIS, ROUTINE W REFLEX MICROSCOPIC - Abnormal; Notable for the following components:   Specific Gravity, Urine >1.046 (*)    Hgb urine dipstick SMALL (*)    Ketones, ur 20 (*)    Protein, ur 30 (*)    Bacteria, UA RARE (*)    All other components within normal limits  LIPASE, BLOOD - Abnormal; Notable for the following components:   Lipase 70 (*)    All other components within normal limits  CBC WITH  DIFFERENTIAL/PLATELET  ETHANOL  TROPONIN I (HIGH SENSITIVITY)  TROPONIN I (HIGH SENSITIVITY)    EKG: EKG Interpretation Date/Time:  Wednesday February 07 2024 08:23:47 EDT Ventricular Rate:  57 PR Interval:  128 QRS Duration:  96 QT Interval:  437 QTC Calculation: 426 R Axis:   84  Text Interpretation: Sinus rhythm Probable left ventricular hypertrophy ST elevation suggests acute pericarditis st elevation increased from first prior of 30 July 2023 Confirmed by Levander Houston [phone]) on 02/07/2024 8:34:43 AM  Radiology: DG Chest Portable 1 View Result Date: 02/07/2024 EXAM: 1 VIEW XRAY OF THE CHEST 02/07/2024 07:57:35 AM COMPARISON: 07/30/2023 CLINICAL HISTORY: CP. Per chart - Patient BIB [facility] c/o n/v and abdominal pain x 3-4 days. Patient reports he normally drinks 1-2 beers daily, last drank 4 days ago. Patient tremulous during triage. Patient also c/o anxiety. FINDINGS: LUNGS AND PLEURA: No focal pulmonary opacity. No pulmonary edema. No pleural effusion. No pneumothorax. HEART AND MEDIASTINUM: No acute abnormality of the cardiac and mediastinal silhouettes. BONES AND SOFT TISSUES: No acute osseous abnormality. IMPRESSION: 1. No acute process. Electronically signed by: Waddell Calk MD 02/07/2024 08:18 AM EDT RP Workstation: [id number]   CT ABDOMEN PELVIS W CONTRAST Result Date: 02/07/2024 EXAM: CT ABDOMEN AND PELVIS WITH CONTRAST 02/07/2024 07:39:05 AM TECHNIQUE: CT of the abdomen and pelvis was performed with the administration of intravenous contrast. Multiplanar reformatted images are provided for review. Automated exposure control, iterative reconstruction, and/or weight-based adjustment of the mA/kV was utilized to reduce the radiation dose to as low as reasonably achievable. COMPARISON: None available. CLINICAL HISTORY: Abdominal pain, acute, nonlocalized. c/o n/v and abdominal pain x 3-4 days. Patient reports he normally drinks 1-2 beers daily, last drank 4 days ago. FINDINGS: LOWER  CHEST: Small to moderate hiatal hernia. LIVER: There is diffuse hepatic steatosis. No focal liver abnormality identified. GALLBLADDER AND BILE DUCTS: Gallbladder is unremarkable. No biliary ductal dilatation. SPLEEN: No acute abnormality. PANCREAS: No acute abnormality. ADRENAL GLANDS: No acute abnormality. KIDNEYS, URETERS AND BLADDER: No stones in the kidneys or ureters. No hydronephrosis. No perinephric or periureteral stranding. Partially decompressed bladder with mild circumferential wall thickening. No surrounding soft tissue stranding. GI AND BOWEL: Stomach demonstrates no acute abnormality. There is no bowel obstruction. The appendix is visualized and appears normal. No signs of bowel inflammation. PERITONEUM AND RETROPERITONEUM: No ascites. No free air. VASCULATURE: Aorta is normal in caliber. LYMPH NODES: No lymphadenopathy. REPRODUCTIVE ORGANS: Prostate gland is normal. BONES AND SOFT TISSUES: No acute  osseous abnormality. No focal soft tissue abnormality. IMPRESSION: 1. No acute findings in the abdomen or pelvis. 2. Diffuse hepatic steatosis without focal liver abnormality. 3. Small to moderate hiatal hernia. 4. Partially decompressed bladder with mild circumferential wall thickening, without surrounding soft tissue stranding. Correlate for any clinical signs or symptoms of cystitis. Electronically signed by: Waddell Calk MD 02/07/2024 07:51 AM EDT RP Workstation: [id number]     Procedures   Medications Ordered in the ED  LORazepam  (ATIVAN ) tablet 1-4 mg (has no administration in time range)    Or  LORazepam  (ATIVAN ) injection 1-4 mg (has no administration in time range)  thiamine  (VITAMIN B1) tablet 100 mg (has no administration in time range)    Or  thiamine  (VITAMIN B1) injection 100 mg (has no administration in time range)  folic acid  (FOLVITE ) tablet 1 mg (has no administration in time range)  multivitamin with minerals tablet 1 tablet (has no administration in time range)   ondansetron  (ZOFRAN ) injection 4 mg (4 mg Intravenous Given 02/07/24 0635)  sodium chloride  0.9 % bolus 1,000 mL (1,000 mLs Intravenous New Bag/Given 02/07/24 0657)                                    Medical Decision Making Amount and/or Complexity of Data Reviewed Labs: ordered. Radiology: ordered.  Risk OTC drugs. Prescription drug management.   This patient presents to the ED for concern of abdominal pain, this involves an extensive number of treatment options, and is a complaint that carries with it a high risk of complications and morbidity.  The differential diagnosis includes cholecystitis, AAA, appendicitis, renal stone, UTI   Co morbidities that complicate the patient evaluation  EtOH abuse   Additional history obtained:  Additional history obtained from 08/31/23 ED visit for similar    Lab Tests:  I personally interpreted labs.  The pertinent results include:   CBC without leukocytosis.  CMP with anion gap of 20, does have ketones in urine as well likely some starvation ketosis.  Alcohol is under 15.  Troponin x 2 within normal limits.   Imaging Studies ordered:  I ordered imaging studies including CT abd/pelvis, chest x-ray I independently visualized and interpreted imaging which showed no acute findings I agree with the radiologist interpretation   Cardiac Monitoring: / EKG:  The patient was maintained on a cardiac monitor.  I personally viewed and interpreted the cardiac monitored which showed an underlying rhythm of: Diffuse ST elevation consistent with acute pericarditis. Patient did have an episode of brief chest pain here at rest that resolved on it's own, exam does seem less consistent with acute pericarditis, two negative troponin, will refer to cardiology OP.    Problem List / ED Course / Critical interventions / Medication management  Patient presenting with 4 days of abdominal pain associated with nausea and vomiting.  He notes daily alcohol use,  last drink 4 days ago. On arrival stable and ill appearing with active vomiting. He did present with NV, abdominal pain and mild tremor and was given ativan  and zofran  which subsequently resolved symptoms. Given reported upper abdominal pain EKG and troponins ordered. Troponin x2 negative. Chest x-ray and CT of abd/pelvis are overall reassuring with no acute findings. Feel symptoms are most likely mild EtOH withdrawal and EtOH gastritis.   I ordered medication including zofran , ativan  Reevaluation of the patient after these medicines showed that the patient improved I have reviewed  the patients home medicines and have made adjustments as needed. Patient took out his own IV and is requesting discharge. Withdrawal symptoms resolved after ativan  and initial CIWA was low, thus do not feel he will require admission for this at this time. He passed PO challenge, is pain free and vitals stable. We discussed lab, imaging and follow up. Will send home with symptoms management and return precautions.       Final diagnoses:  Chest pain, unspecified type  Nausea and vomiting, unspecified vomiting type    ED Discharge Orders          Ordered    ondansetron  (ZOFRAN -ODT) 4 MG disintegrating tablet  Every 8 hours PRN        02/07/24 1037    pantoprazole  (PROTONIX ) 20 MG tablet  Daily        02/07/24 1037    Ambulatory referral to Cardiology       Comments: If you have not heard from the Cardiology office within the next 72 hours please call [phone].   02/07/24 1038               Drevin Ortner, Warren SAILOR, PA-C 02/07/24 1051    Levander Houston, MD 02/07/24 [phone]

## 2024-02-07 NOTE — ED Triage Notes (Signed)
 Patient BIB [facility] c/o n/v and abdominal pain x 3-4 days.  Patient reports he normally drinks 1-2 beers daily, last drank 4 days ago.  Patient tremulous during triage. Patient also c/o anxiety.   152/100 100 HR 20 RR 99% RA CBG 90

## 2024-02-07 NOTE — Progress Notes (Signed)
 CSW added substance abuse resources to patient's AVS.  Niels Portugal, MSW, LCSW Transitions of Care  Clinical Social Worker II [phone]

## 2024-02-07 NOTE — ED Notes (Signed)
 Gave pt. A soda and some crackers

## 2024-02-15 ENCOUNTER — Other Ambulatory Visit (HOSPITAL_COMMUNITY): Payer: Self-pay

## 2024-02-15 ENCOUNTER — Observation Stay (HOSPITAL_COMMUNITY): Payer: Self-pay | Admitting: Anesthesiology

## 2024-02-15 ENCOUNTER — Emergency Department (HOSPITAL_COMMUNITY)
Admission: EM | Admit: 2024-02-15 | Discharge: 2024-02-15 | Discharge status: Against Medical Advice | Payer: Self-pay | Attending: Emergency Medicine | Admitting: Emergency Medicine

## 2024-02-15 ENCOUNTER — Emergency Department (HOSPITAL_COMMUNITY): Payer: Self-pay

## 2024-02-15 ENCOUNTER — Encounter (HOSPITAL_COMMUNITY): Payer: Self-pay

## 2024-02-15 ENCOUNTER — Observation Stay (HOSPITAL_COMMUNITY)
Admission: EM | Admit: 2024-02-15 | Discharge: 2024-02-15 | Disposition: A | Discharge status: Home / Self Care | Payer: Self-pay | Attending: Internal Medicine | Admitting: Internal Medicine

## 2024-02-15 ENCOUNTER — Other Ambulatory Visit: Payer: Self-pay

## 2024-02-15 ENCOUNTER — Encounter (HOSPITAL_COMMUNITY)
Admission: EM | Disposition: A | Discharge status: Home / Self Care | Payer: Self-pay | Source: Home / Self Care | Attending: Emergency Medicine

## 2024-02-15 DIAGNOSIS — I10 Essential (primary) hypertension: Secondary | ICD-10-CM

## 2024-02-15 DIAGNOSIS — K2100 Gastro-esophageal reflux disease with esophagitis, without bleeding: Secondary | ICD-10-CM

## 2024-02-15 DIAGNOSIS — Z03821 Encounter for observation for suspected ingested foreign body ruled out: Secondary | ICD-10-CM | POA: Insufficient documentation

## 2024-02-15 DIAGNOSIS — K2970 Gastritis, unspecified, without bleeding: Secondary | ICD-10-CM

## 2024-02-15 DIAGNOSIS — R059 Cough, unspecified: Secondary | ICD-10-CM

## 2024-02-15 DIAGNOSIS — Z91010 Allergy to peanuts: Secondary | ICD-10-CM | POA: Insufficient documentation

## 2024-02-15 DIAGNOSIS — S032XXA Dislocation of tooth, initial encounter: Principal | ICD-10-CM | POA: Insufficient documentation

## 2024-02-15 DIAGNOSIS — F419 Anxiety disorder, unspecified: Secondary | ICD-10-CM | POA: Insufficient documentation

## 2024-02-15 DIAGNOSIS — R0600 Dyspnea, unspecified: Secondary | ICD-10-CM

## 2024-02-15 DIAGNOSIS — F17210 Nicotine dependence, cigarettes, uncomplicated: Secondary | ICD-10-CM

## 2024-02-15 DIAGNOSIS — Z8711 Personal history of peptic ulcer disease: Secondary | ICD-10-CM | POA: Insufficient documentation

## 2024-02-15 DIAGNOSIS — R070 Pain in throat: Secondary | ICD-10-CM

## 2024-02-15 DIAGNOSIS — X58XXXA Exposure to other specified factors, initial encounter: Secondary | ICD-10-CM | POA: Insufficient documentation

## 2024-02-15 DIAGNOSIS — J449 Chronic obstructive pulmonary disease, unspecified: Secondary | ICD-10-CM | POA: Insufficient documentation

## 2024-02-15 DIAGNOSIS — T189XXA Foreign body of alimentary tract, part unspecified, initial encounter: Secondary | ICD-10-CM

## 2024-02-15 DIAGNOSIS — T17908A Unspecified foreign body in respiratory tract, part unspecified causing other injury, initial encounter: Principal | ICD-10-CM | POA: Insufficient documentation

## 2024-02-15 DIAGNOSIS — T18108A Unspecified foreign body in esophagus causing other injury, initial encounter: Secondary | ICD-10-CM

## 2024-02-15 DIAGNOSIS — R1310 Dysphagia, unspecified: Secondary | ICD-10-CM

## 2024-02-15 DIAGNOSIS — E876 Hypokalemia: Secondary | ICD-10-CM | POA: Insufficient documentation

## 2024-02-15 DIAGNOSIS — F1090 Alcohol use, unspecified, uncomplicated: Secondary | ICD-10-CM | POA: Insufficient documentation

## 2024-02-15 DIAGNOSIS — K449 Diaphragmatic hernia without obstruction or gangrene: Secondary | ICD-10-CM

## 2024-02-15 DIAGNOSIS — Z79899 Other long term (current) drug therapy: Secondary | ICD-10-CM | POA: Insufficient documentation

## 2024-02-15 LAB — CBC
HCT: 40.6 % (ref 39.0–52.0)
Hemoglobin: 14.2 g/dL (ref 13.0–17.0)
MCH: 31.8 pg (ref 26.0–34.0)
MCHC: 35 g/dL (ref 30.0–36.0)
MCV: 91 fL (ref 80.0–100.0)
Platelets: 222 K/uL (ref 150–400)
RBC: 4.46 MIL/uL (ref 4.22–5.81)
RDW: 13.2 % (ref 11.5–15.5)
WBC: 5.6 K/uL (ref 4.0–10.5)
nRBC: 0 % (ref 0.0–0.2)

## 2024-02-15 LAB — BASIC METABOLIC PANEL WITH GFR
Anion gap: 15 (ref 5–15)
BUN: 6 mg/dL (ref 6–20)
CO2: 22 mmol/L (ref 22–32)
Calcium: 9.3 mg/dL (ref 8.9–10.3)
Chloride: 100 mmol/L (ref 98–111)
Creatinine, Ser: 1.25 mg/dL — ABNORMAL HIGH (ref 0.61–1.24)
GFR, Estimated: >60 mL/min (ref >60)
Glucose, Bld: 111 mg/dL — ABNORMAL HIGH (ref 70–99)
Potassium: 3.3 mmol/L — ABNORMAL LOW (ref 3.5–5.1)
Sodium: 137 mmol/L (ref 135–145)

## 2024-02-15 LAB — HIV ANTIBODY (ROUTINE TESTING W REFLEX): HIV Screen 4th Generation wRfx: NONREACTIVE

## 2024-02-15 LAB — ETHANOL: Alcohol, Ethyl (B): <15 mg/dL (ref <15)

## 2024-02-15 SURGERY — EGD (ESOPHAGOGASTRODUODENOSCOPY)
Anesthesia: Monitor Anesthesia Care

## 2024-02-15 SURGERY — BRONCHOSCOPY, FLEXIBLE
Anesthesia: General

## 2024-02-15 SURGERY — EGD (ESOPHAGOGASTRODUODENOSCOPY)
Anesthesia: General

## 2024-02-15 MED ORDER — SODIUM CHLORIDE 0.9 % IV SOLN: INTRAVENOUS | Status: DC | PRN

## 2024-02-15 MED ORDER — SODIUM CHLORIDE 0.9 % IV BOLUS
1000.0000 mL | Freq: Once | INTRAVENOUS | Status: AC
  Administered 2024-02-15: 1000 mL via INTRAVENOUS

## 2024-02-15 MED ORDER — ONDANSETRON 4 MG PO TBDP
4.0000 mg | ORAL_TABLET | Freq: Three times a day (TID) | ORAL | 0 refills | Status: AC | PRN
  Filled 2024-02-15: qty 20, 7d supply, fill #0

## 2024-02-15 MED ORDER — ONDANSETRON HCL 4 MG/2ML IJ SOLN
4.0000 mg | Freq: Once | INTRAMUSCULAR | Status: AC
  Administered 2024-02-15: 4 mg via INTRAVENOUS
  Filled 2024-02-15: qty 2

## 2024-02-15 MED ORDER — ONDANSETRON HCL 4 MG/2ML IJ SOLN
INTRAMUSCULAR | Status: DC | PRN
  Administered 2024-02-15: 4 mg via INTRAVENOUS

## 2024-02-15 MED ORDER — FENTANYL CITRATE (PF) 250 MCG/5ML IJ SOLN
INTRAMUSCULAR | Status: DC | PRN
  Administered 2024-02-15 (×2): 50 ug via INTRAVENOUS

## 2024-02-15 MED ORDER — ALUM & MAG HYDROXIDE-SIMETH 400-400-40 MG/5ML PO SUSP
10.0000 mL | Freq: Four times a day (QID) | ORAL | 0 refills | Status: DC | PRN
  Filled 2024-02-15: qty 500, fill #0
  Filled 2024-02-15: qty 355, 9d supply, fill #0

## 2024-02-15 MED ORDER — ALUM & MAG HYDROXIDE-SIMETH 400-400-40 MG/5ML PO SUSP
10.0000 mL | Freq: Four times a day (QID) | ORAL | 0 refills | Status: AC | PRN
  Filled 2024-02-15: qty 355, 9d supply, fill #0

## 2024-02-15 MED ORDER — SUCCINYLCHOLINE CHLORIDE 200 MG/10ML IV SOSY
PREFILLED_SYRINGE | INTRAVENOUS | Status: DC | PRN
  Administered 2024-02-15: 120 mg via INTRAVENOUS

## 2024-02-15 MED ORDER — IPRATROPIUM-ALBUTEROL 0.5-2.5 (3) MG/3ML IN SOLN
3.0000 mL | Freq: Once | RESPIRATORY_TRACT | Status: AC
Start: 2024-02-15 — End: 2024-02-15
  Administered 2024-02-15: 3 mL via RESPIRATORY_TRACT

## 2024-02-15 MED ORDER — FENTANYL CITRATE (PF) 100 MCG/2ML IJ SOLN
INTRAMUSCULAR | Status: AC
  Filled 2024-02-15: qty 2

## 2024-02-15 MED ORDER — FENTANYL CITRATE PF 50 MCG/ML IJ SOSY
50.0000 ug | PREFILLED_SYRINGE | Freq: Once | INTRAMUSCULAR | Status: AC
  Administered 2024-02-15: 50 ug via INTRAVENOUS
  Filled 2024-02-15: qty 1

## 2024-02-15 MED ORDER — PROPOFOL 10 MG/ML IV BOLUS
INTRAVENOUS | Status: DC | PRN
  Administered 2024-02-15: 200 mg via INTRAVENOUS
  Administered 2024-02-15: 30 mg via INTRAVENOUS
  Administered 2024-02-15: 50 mg via INTRAVENOUS

## 2024-02-15 MED ORDER — LIDOCAINE VISCOUS HCL 2 % MT SOLN
15.0000 mL | Freq: Once | OROMUCOSAL | Status: AC
  Administered 2024-02-15: 15 mL via OROMUCOSAL
  Filled 2024-02-15: qty 15

## 2024-02-15 MED ORDER — LIDOCAINE 2% (20 MG/ML) 5 ML SYRINGE
INTRAMUSCULAR | Status: DC | PRN
  Administered 2024-02-15: 60 mg via INTRAVENOUS

## 2024-02-15 MED ORDER — MIDAZOLAM HCL 2 MG/2ML IJ SOLN
INTRAMUSCULAR | Status: DC | PRN
  Administered 2024-02-15: 2 mg via INTRAVENOUS

## 2024-02-15 MED ORDER — ALUM & MAG HYDROXIDE-SIMETH 200-200-20 MG/5ML PO SUSP
30.0000 mL | Freq: Once | ORAL | Status: DC
  Filled 2024-02-15: qty 30

## 2024-02-15 MED ORDER — PANTOPRAZOLE SODIUM 40 MG PO TBEC: 40.0000 mg | DELAYED_RELEASE_TABLET | Freq: Two times a day (BID) | ORAL | 0 refills | Status: AC

## 2024-02-15 MED ORDER — MIDAZOLAM HCL 2 MG/2ML IJ SOLN
INTRAMUSCULAR | Status: AC
  Filled 2024-02-15: qty 2

## 2024-02-15 MED ORDER — DEXAMETHASONE SODIUM PHOSPHATE 10 MG/ML IJ SOLN
INTRAMUSCULAR | Status: DC | PRN
  Administered 2024-02-15: 8 mg via INTRAVENOUS

## 2024-02-15 MED ORDER — GLYCOPYRROLATE PF 0.2 MG/ML IJ SOSY
PREFILLED_SYRINGE | INTRAMUSCULAR | Status: DC | PRN
  Administered 2024-02-15: .2 mg via INTRAVENOUS

## 2024-02-15 MED ORDER — PANTOPRAZOLE SODIUM 40 MG PO TBEC: 40.0000 mg | DELAYED_RELEASE_TABLET | Freq: Two times a day (BID) | ORAL | 0 refills | Status: DC

## 2024-02-15 MED ORDER — PANTOPRAZOLE SODIUM 40 MG PO TBEC
40.0000 mg | DELAYED_RELEASE_TABLET | Freq: Two times a day (BID) | ORAL | 0 refills | Status: DC
  Filled 2024-02-15: qty 60, 30d supply, fill #0

## 2024-02-15 MED ORDER — IPRATROPIUM-ALBUTEROL 0.5-2.5 (3) MG/3ML IN SOLN
RESPIRATORY_TRACT | Status: AC
  Filled 2024-02-15: qty 3

## 2024-02-15 NOTE — ED Triage Notes (Addendum)
 Pt just left [facility] because he said his wife and kid didn't know how to get to the kids school. He is now back for  a tooth stuck in his throat  which was negative on imaging. Pt is otherwise stable.   Medic vitals   122/88 104hr 24rr 98%ra

## 2024-02-15 NOTE — Consult Note (Signed)
 I agree with the Advanced Practitioner Benton Lesches note from 02/15/2024, impression, and recommendations as outlined. I have taken an independent interval history, reviewed the chart and examined the patient.  My medical decision making is as follows:   Subjective: 38 yo man with etoh use disorder. Was grinding teeth in sleep. Felt a tooth fragment break off. Thinks he might have swallowed it and is having odynophagia, globus. Denies wheeizng, cough, shortness of breath. Due to lack of respiratory symptoms I recommended GI evaluation for EGD. Showed erosive gastritis. No varices. No foreign bodies noted.   Objective: Blood pressure 113/76, pulse (!) 59, resp. rate 17, SpO2 95%.  Well appearing no respiratory distress on room air Breathing non labored RRR Poor dentition No le edema. Normal speech no focal asymmetry   Labs/Imaging: Chest xray reviewed - no foreign body.   Assessment and Plan:  Aspiration of tooth fragment, lodged in esophagus?  Patient says he feels the EGD scope pushed down the tooth fragment down. He is no longer having symptoms. No need for bronchoscopy.    Verdon GORMAN [facility] 02/15/2024 3:02 PM  Pager: see AMION  If no response to pager, please call critical care on call (see AMION) until 7pm After 7:00 pm call Elink

## 2024-02-15 NOTE — Hospital Course (Addendum)
 Steven Mooney is a 38 year old male with a past medical history of suspected COPD and alcohol use disorder who presented  after an episode of coughing, throat pain, and dyspnea after thinking he had swallowed a piece of a chipped tooth overnight.  On presentation he was hemodynamically stable without hypoxia or oxygen requirements.  Lab evaluation showed no significant abnormalities and chest and AP neck x-ray did not visualize any foreign body present in the airways or upper GI tract although could have been obscured by vertebral bone.  Both GI and pulmonary were consulted for evaluation and the patient underwent endoscopy which showed no evidence of foreign body ingestion in the upper airways, esophagus, or stomach but did show grade D reflux esophagitis without bleeding, gastritis that was biopsied, and a 4 cm hiatal hernia.  He was recommended to take pantoprazole  40 mg twice daily for 8 weeks and undergo repeat upper endoscopy in 8 weeks.  He remained stable after EGD and was discharged home with outpatient follow-up in the internal medicine center.  Medications were prescribed through our [facility] pharmacy and given to the patient before discharge.

## 2024-02-15 NOTE — Consult Note (Signed)
 Consultation Note   Referring Provider:   PCCM PCP: Patient, No Pcp Per Primary Gastroenterologist:  Sampson       Reason for Consultation:  Possible foreign body in esophagus DOA: 02/15/2024         Hospital Day: 1   ASSESSMENT    38 year old male with odynophagia / possible foreign body ingestion (piece of tooth).  No obvious foreign body on soft tissue neck x-ray /chest x-ray.   GERD Untreated.  Unable to afford medication  Reported history of COPD Not corroborated by chest x-ray  Reported history of pericarditis  Gynecomastia Likely secondary to previous medication    Principal Problem:   Swallowed foreign body Active Problems:   COPD (chronic obstructive pulmonary disease) (HCC)   Alcohol use disorder    PLAN:   --Schedule for EGD today. The risks and benefits of EGD with possible biopsies were discussed with the patient who agrees to proceed.  -- Discussed with PCCM, they will hold off on bronchoscopy for now as patient has no shortness of breath or stridor to suggest aspiration of tooth  HPI   38 year old male brought to ED this a.m. by EMS after swallowing a large part of his tooth while sleeping .  He has poor dentition, thinks he fractured a tooth and accidentally swallowed it.  Patient complaining that his tooth feels like it is stuck in his throat. No SHOB or wheezing. [facility] P[facility] was called out of concern that he had aspirated the portion of the tooth and needed a bronchoscopy.  However PCCM had a low suspicion of this and contacted GI out of concern that tooth was in his upper esophagus causing pain with swallowing .  Soft tissue neck x-ray unrevealing.  Chest x-ray unrevealing.  Labs unremarkable.  Patient takes no medications at home.    Labs and Imaging:  Recent Labs    02/15/24 0201  WBC 5.6  HGB 14.2  HCT 40.6  MCV 91.0  PLT 222   Recent Labs    02/15/24 0201  NA 137  K 3.3*  CL 100  CO2  22  GLUCOSE 111*  BUN 6  CREATININE 1.25*  CALCIUM 9.3     DG Chest Port 1 View CLINICAL DATA:  Shortness of breath, recently swallowed tooth, initial encounter  EXAM: PORTABLE CHEST 1 VIEW  COMPARISON:  None Available.  FINDINGS: The heart size and mediastinal contours are within normal limits. Both lungs are clear. The visualized skeletal structures are unremarkable. Incidental note is made of right aortic arch stable from the prior exam.  IMPRESSION: No acute abnormality noted. No findings to correspond with the given clinical history of swallowed tooth are seen.  Electronically Signed   By: Oneil Devonshire M.D.   On: 02/15/2024 01:36 DG Neck Soft Tissue CLINICAL DATA:  Shortness of breath, recently swallowed tooth  EXAM: NECK SOFT TISSUES - 1+ VIEW  COMPARISON:  None Available.  FINDINGS: There is no evidence of retropharyngeal soft tissue swelling or epiglottic enlargement. The cervical airway is unremarkable and no radio-opaque foreign body identified.  IMPRESSION: No definitive radiopaque foreign body is identified. Lateral view may have been helpful.  Electronically Signed   By: Oneil Devonshire M.D.   On: 02/15/2024  01:34   Past Medical History:  Diagnosis Date   COPD (chronic obstructive pulmonary disease) (HCC)    Gastric ulcer    Scoliosis     History reviewed. No pertinent surgical history.  History reviewed. No pertinent family history.  Prior to Admission medications   Medication Sig Start Date End Date Taking? Authorizing Provider  Melaton-Thean-Cham-PassF-LBalm (SLEEP PO) Take 1 tablet by mouth at bedtime as needed (sleep).   Yes [provider]  ondansetron  (ZOFRAN -ODT) 4 MG disintegrating tablet Take 1 tablet (4 mg total) by mouth every 8 (eight) hours as needed for nausea or vomiting. Patient not taking: Reported on 02/15/2024 02/07/24   Barrett, Warren SAILOR, PA-C  pantoprazole  (PROTONIX ) 20 MG tablet Take 1 tablet (20 mg total) by  mouth daily. Patient not taking: Reported on 02/15/2024 02/07/24   Barrett, Warren SAILOR, PA-C    No current facility-administered medications for this encounter.   Current Outpatient Medications  Medication Sig Dispense Refill   Melaton-Thean-Cham-PassF-LBalm (SLEEP PO) Take 1 tablet by mouth at bedtime as needed (sleep).     ondansetron  (ZOFRAN -ODT) 4 MG disintegrating tablet Take 1 tablet (4 mg total) by mouth every 8 (eight) hours as needed for nausea or vomiting. (Patient not taking: Reported on 02/15/2024) 20 tablet 0   pantoprazole  (PROTONIX ) 20 MG tablet Take 1 tablet (20 mg total) by mouth daily. (Patient not taking: Reported on 02/15/2024) 30 tablet 0    Allergies as of 02/15/2024 - Review Complete 02/15/2024  Allergen Reaction Noted   Banana Anaphylaxis 02/15/2024   Fish allergy Anaphylaxis 02/15/2024   Peach flavor [flavoring agent] Anaphylaxis 02/15/2024   Peanut-containing drug products Anaphylaxis 02/15/2024   Penicillins  09/19/2018    Social History   Socioeconomic History   Marital status: Single    Spouse name: Not on file   Number of children: Not on file   Years of education: Not on file   Highest education level: Not on file  Occupational History   Not on file  Tobacco Use   Smoking status: Every Day    Current packs/day: 0.25    Types: Cigarettes   Smokeless tobacco: Never  Vaping Use   Vaping status: Never Used  Substance and Sexual Activity   Alcohol use: Yes    Comment: ocassionally   Drug use: Not Currently    Types: Marijuana   Sexual activity: Not on file  Other Topics Concern   Not on file  Social History Narrative   ** Merged History Encounter **       Social Drivers of Corporate investment banker Strain: Not on file  Food Insecurity: Not on file  Transportation Needs: Not on file  Physical Activity: Not on file  Stress: Not on file  Social Connections: Not on file  Intimate Partner Violence: Not on file     Code Status   Code  Status: Full Code  Review of Systems: All systems reviewed and negative except where noted in HPI.  Physical Exam: Vital signs in last 24 hours: Temp:  [98.2 F (36.8 C)] 98.2 F (36.8 C) (09/18 0635) Pulse Rate:  [55-113] 64 (09/18 0745) Resp:  [17-22] 20 (09/18 0745) BP: (107-188)/(76-174) 130/86 (09/18 0745) SpO2:  [94 %-100 %] 100 % (09/18 0745) Weight:  [83.9 kg] 83.9 kg (09/18 0738)    General:  Pleasant male in NAD Psych:  Cooperative. Normal mood and affect Eyes: Pupils equal Ears:  Normal auditory acuity Nose: No deformity, discharge or lesions Neck:  Supple,  no masses felt Lungs:  Clear to auscultation.  Heart:  Regular rate, regular rhythm.  Abdomen:  Soft, nondistended, nontender, active bowel sounds, no masses felt Rectal :  Deferred Msk: Symmetrical without gross deformities.  Neurologic:  Alert, oriented, grossly normal neurologically Extremities : No edema Skin:  Intact without significant lesions.    Intake/Output from previous day: No intake/output data recorded. Intake/Output this shift:  No intake/output data recorded.   Vina Dasen, NP-C   02/15/2024, 9:38 AM   --------------------------------------------------------------------------  I have taken a history, reviewed the chart and examined the patient. I performed a substantive portion of this encounter, including complete performance of at least one of the key components, in conjunction with the APP. I agree with the APP's note, impression and recommendations.  38 year old male with history of anxiety, who apparently fractured his tooth last night in his sleep, and has had persistent severe pain and foreign body sensation.  He is able to tolerate secretions and swallow liquids, but is in significant discomfort.  No evidence of tooth fragment seen on x-rays, but will plan to perform upper endoscopy to assess for embedded tooth fragment/mucosal injury, and rule out retained foreign body. If  unremarkable, would treat supportively with analgesics, viscous lidocaine .  The details, risks (including bleeding, perforation, infection, missed lesions, medication reactions and possible hospitalization or surgery if complications occur), benefits, and alternatives to EGD with possible biopsy and possible dilation were discussed with the patient and he consents to proceed.    Yariela Tison E. Stacia, MD [facility]  Moderate complex medical decision making (this includes chart review, review of results, face-to-face time used for counseling as well as treatment plan and follow-up. The patient was provided an opportunity to ask questions and all were answered. The patient agreed with the plan and demonstrated an understanding of the instructions

## 2024-02-15 NOTE — ED Notes (Signed)
 Pt walked out of the room and said he was leaving the facility. This RN attempted to take pts IV out, and pt stated he already took it out him self. Pt encouraged to stay and finnish the treatment plan. Pt continued to walk out of the facility

## 2024-02-15 NOTE — Consult Note (Signed)
 NAME:  Steven Mooney, MRN:  [id number], DOB:  [birth year], LOS: 0 ADMISSION DATE:  02/15/2024, CONSULTATION DATE:  02/15/2024 REFERRING MD:  Dr. Pamella - EDP, CHIEF COMPLAINT: Aspiration/swallowed foreign body  History of Present Illness:  Steven Mooney is a 38 year old male with a past medical history significant for COPD who presented to the ED at [facility] overnight 9/18 with complaints of swallowing/aspirating foreign body.  Patient reports he was sleeping and thinks he was grinding his teeth and accidentally fractured one of his teeth resulting in either aspiration/swallowing fractured tooth.  Reports he feels tooth is stuck in his throat and is associated with sharp stabbing pain with swallowing.  Of note patient has multiple dental caries and tooth in question that was fractured and has a large cavity throughout majority of tooth.  Denies any dyspnea or wheezing.  Chest x-ray was obtained and negative for any acute abnormalities including no signs of foreign body.  PCCM was consulted for further assistance in management including potential need of bronchoscopy  Pertinent  Medical History  COPD  Significant Hospital Events: Including procedures, antibiotic start and stop dates in addition to other pertinent events   9/18 presented to the ED for concerns of foreign body in esophagus versus trachea  Interim History / Subjective:  Seen sitting up on ED stretcher with no auscultated wheezing or stridor, no signs of difficulty breathing on room air  Objective    Blood pressure 130/86, pulse 64, temperature 98.2 F (36.8 C), resp. rate 20, height 6' (1.829 m), weight 83.9 kg, SpO2 100%.       No intake or output data in the 24 hours ending 02/15/24 0910 Filed Weights   02/15/24 0738  Weight: 83.9 kg    Examination: General: Chronic ill-appearing adult male sitting up in bed in no acute distress HEENT: [place]/AT, MM pink/moist, PERRL,  Neuro: Alert and oriented x 3, nonfocal CV:  s1s2 regular rate and rhythm, no murmur, rubs, or gallops,  PULM: Clear to auscultation bilaterally, no increased work of breathing, no wheezing, no stridor, on room air GI: soft, bowel sounds active in all 4 quadrants, non-tender, non-distended Extremities: warm/dry, no edema  Skin: no rashes or lesions   Resolved problem list   Assessment and Plan  Concern for either aspiration of foreign body - Patient states during his sleep he is concerned he was grinding his teeth and now worries he either swallowed or inhaled the fractured tooth. Given the patient is experiencing pain with swallowing and no signs of wheezing or stridor I am worried tooth may be within his esophagus and not trachea therefore I have reached out to the GI team to see if they can evaluate patient for potential endoscopy versus bronchoscopy.  If GI team feels bronchoscopy is a better procedure to examine potential foreign body we will proceed with procedure today at noon in the endoscopy suite.  P: Admit per medicine Keep n.p.o. GI consulted   Labs   CBC: Recent Labs  Lab 02/15/24 0201  WBC 5.6  HGB 14.2  HCT 40.6  MCV 91.0  PLT 222    Basic Metabolic Panel: Recent Labs  Lab 02/15/24 0201  NA 137  K 3.3*  CL 100  CO2 22  GLUCOSE 111*  BUN 6  CREATININE 1.25*  CALCIUM 9.3   GFR: Estimated Creatinine Clearance: 88.8 mL/min (A) (by C-G formula based on SCr of 1.25 mg/dL (H)). Recent Labs  Lab 02/15/24 0201  WBC 5.6  Liver Function Tests: No results for input(s): AST, ALT, ALKPHOS, BILITOT, PROT, ALBUMIN in the last 168 hours. No results for input(s): LIPASE, AMYLASE in the last 168 hours. No results for input(s): AMMONIA in the last 168 hours.  ABG No results found for: PHART, PCO2ART, PO2ART, HCO3, TCO2, ACIDBASEDEF, O2SAT   Coagulation Profile: No results for input(s): INR, PROTIME in the last 168 hours.  Cardiac Enzymes: No results for input(s):  CKTOTAL, CKMB, CKMBINDEX, TROPONINI in the last 168 hours.  HbA1C: No results found for: HGBA1C  CBG: No results for input(s): GLUCAP in the last 168 hours.  Review of Systems:   Please see the history of present illness. All other systems reviewed and are negative   Past Medical History:  He,  has a past medical history of COPD (chronic obstructive pulmonary disease) (HCC), Gastric ulcer, and Scoliosis.   Surgical History:  History reviewed. No pertinent surgical history.   Social History:   reports that he has been smoking cigarettes. He has never used smokeless tobacco. He reports current alcohol use. He reports that he does not currently use drugs after having used the following drugs: Marijuana.   Family History:  His family history is not on file.   Allergies Allergies  Allergen Reactions   Banana Anaphylaxis   Fish Allergy Anaphylaxis   Peach Flavor [Flavoring Agent] Anaphylaxis   Peanut-Containing Drug Products Anaphylaxis   Penicillins      Home Medications  Prior to Admission medications   Medication Sig Start Date End Date Taking? Authorizing Provider  Melaton-Thean-Cham-PassF-LBalm (SLEEP PO) Take 1 tablet by mouth at bedtime as needed (sleep).   Yes [provider]  ondansetron  (ZOFRAN -ODT) 4 MG disintegrating tablet Take 1 tablet (4 mg total) by mouth every 8 (eight) hours as needed for nausea or vomiting. Patient not taking: Reported on 02/15/2024 02/07/24   Barrett, Warren SAILOR, PA-C  pantoprazole  (PROTONIX ) 20 MG tablet Take 1 tablet (20 mg total) by mouth daily. Patient not taking: Reported on 02/15/2024 02/07/24   Barrett, Warren SAILOR, PA-C     Critical care time: NA  Baili Stang D. Harris, NP-C [facility] Personal contact information can be found on Amion  If no contact or response made please call [phone] 02/15/2024, 9:18 AM

## 2024-02-15 NOTE — Anesthesia Procedure Notes (Signed)
 Procedure Name: Intubation Date/Time: 02/15/2024 11:00 AM  Performed by: Delores Duwaine SAUNDERS, CRNAPre-anesthesia Checklist: Patient identified, Emergency Drugs available, Suction available and Patient being monitored Patient Re-evaluated:Patient Re-evaluated prior to induction Oxygen Delivery Method: Circle System Utilized Preoxygenation: Pre-oxygenation with 100% oxygen Induction Type: IV induction, Cricoid Pressure applied and Rapid sequence Laryngoscope Size: Mac and 4 Grade View: Grade I Tube type: Oral Tube size: 7.5 mm Number of attempts: 1 Airway Equipment and Method: Stylet and Oral airway Placement Confirmation: ETT inserted through vocal cords under direct vision, positive ETCO2 and breath sounds checked- equal and bilateral Secured at: 22 cm Tube secured with: Tape Dental Injury: Teeth and Oropharynx as per pre-operative assessment

## 2024-02-15 NOTE — Transfer of Care (Signed)
 Immediate Anesthesia Transfer of Care Note  Patient: Steven Mooney  Procedure(s) Performed: EGD (ESOPHAGOGASTRODUODENOSCOPY)  Patient Location: PACU  Anesthesia Type:General  Level of Consciousness: awake and alert   Airway & Oxygen Therapy: Patient Spontanous Breathing  Post-op Assessment: Report given to RN  Post vital signs: Reviewed and stable  Last Vitals:  Vitals Value Taken Time  BP 188/102 02/15/24 11:51  Temp    Pulse 100 02/15/24 11:56  Resp 16 02/15/24 11:56  SpO2 99 % 02/15/24 11:56  Vitals shown include unfiled device data.  Last Pain:  Vitals:   02/15/24 1150  PainSc: 0-No pain         Complications: No notable events documented.

## 2024-02-15 NOTE — H&P (Cosign Needed Addendum)
 Date: 02/15/2024               Patient Name:  Steven Mooney MRN: [id number]  DOB: 1986-05-11 Age / Sex: 38 y.o., male   PCP: Patient, No Pcp Per         Medical Service: Internal Medicine Teaching Service         Attending Physician: Dr. MICAEL Riis Winfrey      First Contact: Rebecka Pion, DO}    Second Contact: Dr. Roetta Chars, MD          Pager Information: First Contact Pager: [phone]   Second Contact Pager: [phone]   SUBJECTIVE   Chief Complaint:  Tooth stuck in my throat  History of Present Illness: Steven Mooney. Pile is a 38 year old male with a past medical history of COPD, currently not using inhalers, who presents to the ED via EMS after suspected ingestion of a chipped tooth during sleep.  The patient reports awakening with the sensation of having swallowed something, followed by a brief episode of coughing and dyspnea. He suspects he may have swallowed a portion of his tooth, as he is known to grind his teeth at night. On self-examination in the mirror, he noticed a small chip missing from one of his teeth. Concerned about possible complications, he called EMS and requested evaluation and possible removal of the object.  At present, he denies choking, ongoing coughing, shortness of breath, chest or abdominal pain, nausea, vomiting, or fever.He however endorse mild dysphagia.   ED Course: In the ED BMP was remarkable with  mild hypokalemia of 3.3 but otherwise normal electrolytes.  His creatinine was at 1.25 from 1.3 eight days  days ago.  CBC was largely unremarkable and there was no evidence of alcohol intoxication.  Rapid urine drug screen was ordered but has not been collected yet.  Imaging including chest x-ray did not show any evidence of acute intra pulmonary pathology.  The other neck soft tissue was also did not show any evidence of foreign body at that time.  Pulmonology and GI  was consulted   Meds:  Patient reported: Patient is not taking any  medications  Past Medical History COPD Gastric ulcer  Past Surgical History History reviewed. No pertinent surgical history.   Social:  Lives With: Daughter, 59 year old and his  wife Occupation: Unemployed ,has never worked in his life Support: Wife Level of Function: Dependent on all ADLs and IADLs PCP: No PCP.  Reports he had one in the past but could not follow-up anymore due to lack of insurance Substances: -Tobacco: Smokes about 5 to 10 cigarettes a day since age 84 -Alcohol: Reports he drinks about 40 ounces of liquor a day since age 36, last drink was 3 days ago -Recreational Drug: Denies previous or current use  Family History:  History reviewed. No pertinent family history.   Allergies: Penicillin - hives  Review of Systems: A complete ROS was negative except as per HPI.   OBJECTIVE:   Physical Exam: Blood pressure 130/86, pulse 64, temperature 98.2 F (36.8 C), resp. rate 20, height 6' (1.829 m), weight 83.9 kg, SpO2 100%.   On examination, the patient is an unkempt male, seated in bed and not in acute distress.Watching reels on his phone. There is no visible evidence of head trauma. Oral examination reveals dry mucous membranes. Neck: No visible swelling or bruising. No tenderness, palpable masses, or subcutaneous crepitus. No stridor or signs of respiratory distress. Voice is clear.Poor dentition  Lungs: Clear to auscultation bilaterally. Heart: Normal S1 and S2 with regular rate and rhythm; no murmurs appreciated. Abdomen: Soft, non-tender, with no distention or organomegaly. Extremities/Neurological: Full strength (5/5) in bilateral upper and lower extremities. No lower extremity edema noted.   Labs: CBC : Largely unremarkable BMP: Potassium of 3.3, creatinine 1.25 from 1.30 a week days ago    Imaging:  DG Chest Port 1 View Result Date: 02/15/2024 IMPRESSION: No acute abnormality noted. No findings to correspond with the given clinical  history of swallowed tooth are seen. Electronically Signed   By: Oneil Devonshire M.D.   On: 02/15/2024 01:36   DG Neck Soft Tissue Result Date: 02/15/2024 IMPRESSION: No definitive radiopaque foreign body is identified. Lateral view may have been helpful. Electronically Signed   By: Oneil Devonshire M.D.   On: 02/15/2024 01:34      ASSESSMENT & PLAN:   Assessment & Plan by Problem: Principal Problem:   Aspiration of foreign body Active Problems:   COPD (chronic obstructive pulmonary disease) (HCC)   Alcohol use disorder   This is 38 year old male with a history of COPD, presenting with suspected aspiration of a chipped tooth during sleep, currently stable and asymptomatic, pending EGD by GI for evaluation and foreign body removal.  # Aspiration of foreign body The patient presents with suspected aspiration of a chipped tooth fragment during sleep. He had a brief episode of coughing but currently denies respiratory distress, choking, or dyspnea. Physical examination and initial imaging reveal no acute abnormalities. The patient remains hemodynamically stable. Due to the potential risk of an airway foreign body, I agree with admitting him for further evaluation. PCCM has been consulted, and the case was also discussed with GI. GI plans to attempt an EGD first, as the foreign body is suspected to be in the esophagus rather than the trachea. If the EGD is negative, PCCM may proceed with bronchoscopy. - EGD today - PCCM to consider bronch post EGD - Monitor for any signs of respiratory distress or airway compromise while awaiting procedure. - Appreciate PCCM / GI  - Consider dental evaluation outpatient for management of chipped tooth   # Alcohol use disorder The patient reports consuming approximately 40 ounces of hard liquor daily for the past 10 years. He denies any history of alcohol-related seizures but acknowledges experiencing episodes of withdrawal characterized by shaking, irritability,  sweating, nausea, vomiting, and a chronic loss of appetite. He states his last drink was three days ago and currently denies any withdrawal symptoms. On examination, mild tremors were observed in the upper extremities with arm extension. The patient is alert, oriented, and responding appropriately without any signs of confusion. A CIWA protocol will be initiated for ongoing monitoring of alcohol withdrawal symptoms. - Initiate CIWA protocol  - Monitor vital signs closely and reassess neurological status regularly. - Provide supportive care - Consider Ativan  if  withdrawal symptoms develop or escalate. - Counsel patient on alcohol cessation and discuss options for long-term addiction treatment  #Hypokalemia  K mildly decreased at 3.3.  I suspect this is due to inadequate p.o. intake in the setting of chronic alcohol use.  Denies any heart palpitations or muscle weakness at this time.  Will repeat BMP after his procedure and replete if clinically indicated. - Repeat BMP - Replete if needed   #Elevated Cr - Scr decreased from 1.3 a week ago to 1.25 on admission. - The likely etiology is poor oral intake due to loss of appetite associated with alcohol use. -  The patient is making urine and does not report any urinary symptoms. - Will continue to monitor renal function.  #Hx of COPD The patient has a reported history of COPD but is not currently using any inhalers due to lack of insurance. During my encounter this morning, he was noted to be coughing up sputum, raising concern for a possible acute exacerbation. Given his cough and the suspicion of a surrounding foreign body, this could represent an exacerbation despite the absence of dyspnea. - Consider treating for COPD exacerbation if dyspnea occurs - Monitor oxygen saturation and provide supplemental oxygen if needed - Patient may benefit from [facility] consult  to discuss options for COPD management despite insurance limitations, including access to  low-cost inhalers or assistance programs  #Hx of Gastric ulcer The patient reports a history of peptic ulcer disease and was prescribed omeprazole  and sucralfate. However, he has not taken these medications for over a year due to financial constraints. I am concerned about this non-adherence, especially given his history of heavy alcohol use. Although this is not an immediate issue during his hospitalization, I am interested to see the findings of the upcoming EGD. I anticipate that his ulcers may have worsened and may require treatment. However, cost may continue to be a barrier to his care. I would appreciate if the Transition of Care (TOC) team could provide any assistance to support this patient..    Best practice: Diet: NPO VTE: SCDs IVF: None,None Code: Full  Disposition planning: Prior to Admission Living Arrangement: Home, living with family  Anticipated Discharge Location: Home  Dispo: Admit patient to Observation with expected length of stay less than 2 midnights.  Signed: Renne Homans, MD Internal Medicine Resident  02/15/2024, 10:05 AM  On Call pager: [phone]

## 2024-02-15 NOTE — ED Triage Notes (Signed)
 Pt BIB [facility] after swallowing a large part of his tooth. Pt feels like his tooth is stuck in his throat.

## 2024-02-15 NOTE — Discharge Summary (Signed)
 Name: Steven Mooney MRN: [id number] DOB: 1986/04/16 38 y.o. PCP: Patient, No Pcp Per  Date of Admission: 02/15/2024  6:33 AM Date of Discharge: 02/15/2024  Attending Physician: Dr. MICAEL Riis Winfrey  Discharge Diagnosis: Principal Problem:   Aspiration of foreign body Active Problems:   COPD (chronic obstructive pulmonary disease) (HCC)   Alcohol use disorder   Odynophagia Reflux esophagitis Gastritis Hiatal hernia  Discharge Medications: Allergies as of 02/15/2024       Reactions   Banana Anaphylaxis   Fish Allergy Anaphylaxis   Peach Flavor [flavoring Agent] Anaphylaxis   Peanut-containing Drug Products Anaphylaxis   Penicillins         Medication List     TAKE these medications    alum & mag hydroxide-simeth 400-400-40 MG/5ML suspension Commonly known as: MAALOX PLUS Take 10 mLs by mouth every 6 (six) hours as needed.   ondansetron  4 MG disintegrating tablet Commonly known as: ZOFRAN -ODT Take 1 tablet (4 mg total) by mouth every 8 (eight) hours as needed for nausea or vomiting.   pantoprazole  40 MG tablet Commonly known as: Protonix  Take 1 tablet (40 mg total) by mouth 2 (two) times daily. What changed:  medication strength how much to take when to take this   SLEEP PO Take 1 tablet by mouth at bedtime as needed (sleep).        Disposition and follow-up:   [name] was discharged from [facility] in Good condition.  At the hospital follow up visit please address:  1.  Follow-up:  a.  Reevaluate for continued symptoms of foreign body ingestion/aspiration.    b.  Ensure gastroenterology follow-up for repeat endoscopy around 04/16/2024.  Follow-up gastric biopsy results.   c.  Address alcohol use disorder and possibility of naltrexone or detox if needed.  Follow-up Appointments:  Follow-up Information     MEDICAID Follow up.   Contact information: Sign up  [email or web address] Phone  help: [phone]        Myrna Bitters, DO. Go on 02/28/2024.   Specialty: Internal Medicine Why: at 9:45 AM, please arrive 15-30 minutes early Contact information: [address] [place] [address] [phone]                 Hospital Course by problem list: [name] is a 38 year old male with a past medical history of suspected COPD and alcohol use disorder who presented  after an episode of coughing, throat pain, and dyspnea after thinking he had swallowed a piece of a chipped tooth overnight.  On presentation he was hemodynamically stable without hypoxia or oxygen requirements.  Lab evaluation showed no significant abnormalities and chest and AP neck x-ray did not visualize any foreign body present in the airways or upper GI tract although could have been obscured by vertebral bone.  Both GI and pulmonary were consulted for evaluation and the patient underwent endoscopy which showed no evidence of foreign body ingestion in the upper airways, esophagus, or stomach but did show grade D reflux esophagitis without bleeding, gastritis that was biopsied, and a 4 cm hiatal hernia.  He was recommended to take pantoprazole  40 mg twice daily for 8 weeks and undergo repeat upper endoscopy in 8 weeks.  He remained stable after EGD and was discharged home with outpatient follow-up in the internal medicine center.  Medications were prescribed through our [facility] pharmacy and given to the patient before discharge.    Discharge Subjective: As above he was doing  well after EGD and wanted to go home.  Discharge Exam:   BP (!) 148/99   Pulse (!) 107   Temp 98.2 F (36.8 C)   Resp 18   Ht 6' (1.829 m)   Wt 83.9 kg   SpO2 98%   BMI 25.09 kg/m  Constitutional: Chronically ill-appearing adult male. In no acute distress. Pulm: Normal work of breathing on room air. FDX:Wzhjupcz for extremity edema.  Pertinent Labs, Studies, and Procedures:     Latest Ref Rng & Units  02/15/2024    2:01 AM 02/07/2024    6:37 AM 08/31/2023    8:52 AM  CBC  WBC 4.0 - 10.5 K/uL 5.6  6.3  8.3   Hemoglobin 13.0 - 17.0 g/dL 85.7  84.2  85.1   Hematocrit 39.0 - 52.0 % 40.6  45.7  43.6   Platelets 150 - 400 K/uL 222  277  320        Latest Ref Rng & Units 02/15/2024    2:01 AM 02/07/2024    6:37 AM 08/31/2023    8:52 AM  CMP  Glucose 70 - 99 mg/dL 888  97  74   BUN 6 - 20 mg/dL 6  8  9    Creatinine 0.61 - 1.24 mg/dL 8.74  8.69  8.82   Sodium 135 - 145 mmol/L 137  138  140   Potassium 3.5 - 5.1 mmol/L 3.3  4.0  3.9   Chloride 98 - 111 mmol/L 100  98  101   CO2 22 - 32 mmol/L 22  20  20    Calcium 8.9 - 10.3 mg/dL 9.3  89.7  9.6   Total Protein 6.5 - 8.1 g/dL  8.1  7.5   Total Bilirubin 0.0 - 1.2 mg/dL  2.1  1.8   Alkaline Phos 38 - 126 U/L  63  56   AST 15 - 41 U/L  78  65   ALT 0 - 44 U/L  94  61     DG Chest Port 1 View Result Date: 02/15/2024 CLINICAL DATA:  Shortness of breath, recently swallowed tooth, initial encounter EXAM: PORTABLE CHEST 1 VIEW COMPARISON:  None Available. FINDINGS: The heart size and mediastinal contours are within normal limits. Both lungs are clear. The visualized skeletal structures are unremarkable. Incidental note is made of right aortic arch stable from the prior exam. IMPRESSION: No acute abnormality noted. No findings to correspond with the given clinical history of swallowed tooth are seen. Electronically Signed   By: Oneil Devonshire M.D.   On: 02/15/2024 01:36   DG Neck Soft Tissue Result Date: 02/15/2024 CLINICAL DATA:  Shortness of breath, recently swallowed tooth EXAM: NECK SOFT TISSUES - 1+ VIEW COMPARISON:  None Available. FINDINGS: There is no evidence of retropharyngeal soft tissue swelling or epiglottic enlargement. The cervical airway is unremarkable and no radio-opaque foreign body identified. IMPRESSION: No definitive radiopaque foreign body is identified. Lateral view may have been helpful. Electronically Signed   By: Oneil Devonshire  M.D.   On: 02/15/2024 01:34     Discharge Instructions:   Discharge Instructions      Steven Mooney,  You were recently admitted to [facility] for esophagogastroduodenoscopy to evaluate if you had a piece of a tooth in your stomach.  This did not show any evidence that you have swallowed a piece of the tooth but it did show some irritation in the lining of your esophagus from acid reflux and irritation in your  stomach.  The gastroenterologist would like you to take some medications to help with this.  They would also like you to follow-up with them in 8 weeks to repeat this test.  Continue taking your home medications with the following changes  Start taking Pantoprazole  40 mg twice daily  You should seek further medical care if you having trouble breathing, coughing up any blood, throwing up, or have any other worrisome symptoms.    Sincerely, Fairy Pool, DO    Signed: Fairy Pool, DO Internal Medicine Resident, PGY-3 Please contact the on call pager at [phone] for any urgent or emergent needs. 2:23 PM 02/15/2024

## 2024-02-15 NOTE — ED Provider Notes (Signed)
 [facility] DEPT [facility] Emergency Department Provider Note MRN:  [id number]  Arrival date & time: 02/15/24     Chief Complaint   Swallowed Foreign Body   History of Present Illness   Steven Mooney is a 38 y.o. year-old male with a history of COPD presenting to the ED with chief complaint of swallowed foreign body.  Patient was sleeping and he thinks that his tooth tractor fell out and then he accidentally swallowed it.  Feels like it stuck in his throat.  Feels uncomfortable in the anterior throat area.  Feels short of breath.  Review of Systems  A thorough review of systems was obtained and all systems are negative except as noted in the HPI and PMH.   Patient's Health History    Past Medical History:  Diagnosis Date   COPD (chronic obstructive pulmonary disease) (HCC)    Gastric ulcer    Scoliosis     History reviewed. No pertinent surgical history.  History reviewed. No pertinent family history.  Social History   Socioeconomic History   Marital status: Single    Spouse name: Not on file   Number of children: Not on file   Years of education: Not on file   Highest education level: Not on file  Occupational History   Not on file  Tobacco Use   Smoking status: Every Day    Current packs/day: 0.25    Types: Cigarettes   Smokeless tobacco: Never  Vaping Use   Vaping status: Never Used  Substance and Sexual Activity   Alcohol use: Yes    Comment: ocassionally   Drug use: Not Currently    Types: Marijuana   Sexual activity: Not on file  Other Topics Concern   Not on file  Social History Narrative   ** Merged History Encounter **       Social Drivers of Corporate investment banker Strain: Not on file  Food Insecurity: Not on file  Transportation Needs: Not on file  Physical Activity: Not on file  Stress: Not on file  Social Connections: Not on file  Intimate Partner Violence: Not on file     Physical Exam   Vitals:   02/15/24 0200  02/15/24 0230  BP: (!) 131/99 (!) 145/101  Pulse: 64 (!) 109  Resp:    SpO2: 99% 99%    CONSTITUTIONAL: Well-appearing, anxious and in moderate distress due to discomfort NEURO/PSYCH:  Alert and oriented x 3, no focal deficits EYES:  eyes equal and reactive ENT/NECK:  no LAD, no JVD CARDIO: Tachycardic rate, well-perfused, normal S1 and S2 PULM:  CTAB no wheezing or rhonchi GI/GU:  non-distended, non-tender MSK/SPINE:  No gross deformities, no edema SKIN:  no rash, atraumatic   *Additional and/or pertinent findings included in MDM below  Diagnostic and Interventional Summary    EKG Interpretation Date/Time:    Ventricular Rate:    PR Interval:    QRS Duration:    QT Interval:    QTC Calculation:   R Axis:      Text Interpretation:         Labs Reviewed  BASIC METABOLIC PANEL WITH GFR - Abnormal; Notable for the following components:      Result Value   Potassium 3.3 (*)    Glucose, Bld 111 (*)    Creatinine, Ser 1.25 (*)    All other components within normal limits  CBC    DG Chest [facility] 1 View  Final Result    DG Neck  Soft Tissue  Final Result      Medications  sodium chloride  0.9 % bolus 1,000 mL (1,000 mLs Intravenous New Bag/Given 02/15/24 0134)  ondansetron  (ZOFRAN ) injection 4 mg (4 mg Intravenous Given 02/15/24 0133)  fentaNYL  (SUBLIMAZE ) injection 50 mcg (50 mcg Intravenous Given 02/15/24 0133)  lidocaine  (XYLOCAINE ) 2 % viscous mouth solution 15 mL (15 mLs Mouth/Throat Given 02/15/24 0135)     Procedures  /  Critical Care Procedures  ED Course and Medical Decision Making  Initial Impression and Ddx Patient is moving air without issue, talking, phonating.  Possibly stuck in the esophagus, awaiting x-ray.  Past medical/surgical history that increases complexity of ED encounter:    Interpretation of Diagnostics I personally reviewed the Chest Xray and my interpretation is as follows: No abnormalities  No significant blood count or electrolyte  disturbance.  Patient Reassessment and Ultimate Disposition/Management     Patient with persistent symptoms and coughing, with the coughing raising concern for teeth in the airway.  I evaluated the patient's tooth and it would only be a very small chip of the tooth that has been ingested and so may be difficult to see on the x-ray.  I discussed the case with morning by the intensivist service, patient should be admitted to the hospitalist service to await bronchoscopy in the morning.  Patient management required discussion with the following services or consulting groups:  Hospitalist Service and Intensivist Service  Complexity of Problems Addressed Acute illness or injury that poses threat of life of bodily function  Additional Data Reviewed and Analyzed Further history obtained from: None  Additional Factors Impacting ED Encounter Risk Consideration of hospitalization  [name], MD [facility] Emergency Medicine [facility] [email or web address]  Final Clinical Impressions(s) / ED Diagnoses     ICD-10-CM   1. Swallowed foreign body, initial encounter  [email or web address]       ED Discharge Orders     None        Discharge Instructions Discussed with and Provided to Patient:   Discharge Instructions   None      Theadore [name], MD 02/15/24 [phone]

## 2024-02-15 NOTE — Anesthesia Preprocedure Evaluation (Addendum)
 Anesthesia Evaluation  Patient identified by MRN, date of birth, ID band Patient awake    Reviewed: Allergy & Precautions, NPO status , Patient's Chart, lab work & pertinent test results  History of Anesthesia Complications Negative for: history of anesthetic complications  Airway Mallampati: II  TM Distance: >3 FB Neck ROM: Full    Dental  (+) Dental Advisory Given, Missing, Poor Dentition   Pulmonary COPD, Current SmokerPatient did not abstain from smoking.   Pulmonary exam normal        Cardiovascular hypertension, Normal cardiovascular exam     Neuro/Psych  PSYCHIATRIC DISORDERS       Adjustment d/o negative neurological ROS     GI/Hepatic PUD,,,(+)     substance abuse  alcohol use and marijuana use Possible swallowed tooth     Endo/Other   K 3.3   Renal/GU Renal InsufficiencyRenal disease     Musculoskeletal  Scoliosis    Abdominal   Peds  Hematology negative hematology ROS (+)   Anesthesia Other Findings   Reproductive/Obstetrics                              Anesthesia Physical Anesthesia Plan  ASA: 3 and emergent  Anesthesia Plan: General   Post-op Pain Management: Minimal or no pain anticipated   Induction: Intravenous and Rapid sequence  PONV Risk Score and Plan: 1 and Treatment may vary due to age or medical condition, Ondansetron  and Midazolam   Airway Management Planned: Oral ETT  Additional Equipment: None  Intra-op Plan:   Post-operative Plan: Extubation in OR  Informed Consent: I have reviewed the patients History and Physical, chart, labs and discussed the procedure including the risks, benefits and alternatives for the proposed anesthesia with the patient or authorized representative who has indicated his/her understanding and acceptance.     Dental advisory given  Plan Discussed with: CRNA and Anesthesiologist  Anesthesia Plan Comments:           Anesthesia Quick Evaluation

## 2024-02-15 NOTE — Op Note (Signed)
 [facility] Patient Name: Steven Mooney Procedure Date : 02/15/2024 MRN: [id number] Attending MD: Glendia BRAVO. Stacia , MD, [id number] Date of Birth: [birth year] CSN: [id number] Age: 38 Admit Type: Inpatient Procedure:                Upper GI endoscopy Indications:              Odynophagia, Suspected ingestion of foreign body Providers:                Glendia E. Stacia, MD, Willy Hummer, RN,                            Coye Bade, Technician Referring MD:              Medicines:                Monitored Anesthesia Care Complications:            No immediate complications. Estimated Blood Loss:     Estimated blood loss was minimal. Procedure:                Pre-Anesthesia Assessment:                           - Prior to the procedure, a History and Physical                            was performed, and patient medications and                            allergies were reviewed. The patient's tolerance of                            previous anesthesia was also reviewed. The risks                            and benefits of the procedure and the sedation                            options and risks were discussed with the patient.                            All questions were answered, and informed consent                            was obtained. Prior Anticoagulants: The patient has                            taken no anticoagulant or antiplatelet agents. ASA                            Grade Assessment: III - A patient with severe                            systemic disease. After reviewing the risks and  benefits, the patient was deemed in satisfactory                            condition to undergo the procedure.                           After obtaining informed consent, the endoscope was                            passed under direct vision. Throughout the                            procedure, the patient's blood pressure, pulse, and                             oxygen saturations were monitored continuously. The                            [id number]-H190 ([id number]) Olympus endoscope was introduced                            through the mouth, and advanced to the second part                            of duodenum. The upper GI endoscopy was                            accomplished without difficulty. The patient                            tolerated the procedure well. Scope In: Scope Out: Findings:      The examined portions of the nasopharynx, oropharynx and larynx were       normal.      LA Grade D (one or more mucosal breaks involving at least 75% of       esophageal circumference) esophagitis with no bleeding was found in the       lower third of the esophagus.      The exam of the esophagus was otherwise normal.      A 4 cm hiatal hernia was present.      Localized mild inflammation characterized by erythema was found in the       gastric body. Biopsies were taken with a cold forceps for Helicobacter       pylori testing. Estimated blood loss was minimal.      The exam of the stomach was otherwise normal.      The examined duodenum was normal. Impression:               - The examined portions of the nasopharynx,                            oropharynx and larynx were normal.                           - LA Grade D reflux esophagitis with no bleeding.                           -  4 cm hiatal hernia.                           - Gastritis. Biopsied.                           - Normal examined duodenum.                           - There was no evidence of any retained tooth                            fragment or other foreign body in the upper airway                            or esophagus. No was there any evidence of any                            mucosal injury/laceration/bleeding in the upper                            airway or proximal esophagus. Moderate Sedation:      N/A Recommendation:           - Return patient to  hospital ward for possible                            discharge same day.                           - Advance diet as tolerated.                           - Continue present medications.                           - Use Protonix  (pantoprazole ) 40 mg PO BID for 8                            weeks.                           - Repeat upper endoscopy in 8 weeks to check                            healing.                           - Recommend analgesia and viscous lidocaine  as                            needed to help with patient's pain.                           - If symptoms not improving, recommend ENT  evaluation. Procedure Code(s):        --- Professional ---                           [phone], Esophagogastroduodenoscopy, flexible,                            transoral; with biopsy, single or multiple Diagnosis Code(s):        --- Professional ---                           K21.00, Gastro-esophageal reflux disease with                            esophagitis, without bleeding                           K44.9, Diaphragmatic hernia without obstruction or                            gangrene                           K29.70, Gastritis, unspecified, without bleeding                           R13.10, Dysphagia, unspecified                           S96.178, Encounter for observation for suspected                            ingested foreign body ruled out CPT copyright 2022 American Medical Association. All rights reserved. The codes documented in this report are preliminary and upon coder review may  be revised to meet current compliance requirements. Nalu Troublefield E. Stacia, MD 02/15/2024 11:36:39 AM This report has been signed electronically. Number of Addenda: 0

## 2024-02-15 NOTE — Care Management (Signed)
 Transition of Care [facility]) - Inpatient Brief Assessment   Patient Details  Name: Steven Mooney MRN: [id number] Date of Birth: [birth year]  Transition of Care [facility]) CM/SW Contact:    Corean JAYSON Canary, RN Phone Number: 02/15/2024, 12:52 PM   Clinical Narrative: Met with patient at bedside, He states he has no money for prescriptions, does n ot have a PCP or insurance, they are interested in Applying for Medicaid, Information on AVS. MATCH done, patient wants his medications to [facility] pharmacy to get them before he is discharged. Messaged them to see if they can pull the script over from [place]. Patient requesting mylanta and lidocaine  for heartburn and sore throat, MD messaged.  Patient is eligible for MATCH medication assistance MATCH MEDICATION ASSISTANCE CARD Pharmacies please call: [phone] for claim processing assistance.  Rx BIN: [id number] Rx Group: [id number] Rx PCN: [id number]  Patient ID (MRN): Jolynn [id number]   Patient Name   Steven Mooney   Patient DOB: 1985-07-11   Discharge Date: 02/15/2024   Expiration Date:  02/24/2024 (must be filled within 7 days of discharge)  Dear  Ozell physician filled through our [facility] (Medication Assistance Through [facility]) program. This program allows for a one-time (no refills) 34-day supply of selected medications for a low copay amount.  The copay is $3.00 per prescription. For instance, if you have one prescription, you will pay $3.00; for two prescriptions, you pay $6.00; for three prescriptions, you pay $9.00; and so on. Only certain pharmacies are participating in this program with [facility]. You will need to select one of the pharmacies from the attached lists and take your prescriptions, this letter, and your photo ID to one of the participating pharmacies.  We are excited that you are able to use the [facility] program to get your medications. These prescriptions must be filled within 7 days of hospital discharge or they will no  longer be valid for the [facility] program. Should you have any problems with your prescriptions please contact your case management team member at [phone] for [place] or [phone] for [facility].  Thank you, [facility]  [facility] Program Pharmacies [place]. [facility]  [facility] Pharmacies [facility] (continued) [facility] (continue [id number] [place], [place]  [address] [place], [place] [address], [place], [employer] 3518 [place], [place] 130 [place] [address] [place], [address], [place] Transitions of Care - [facility] [address] [place] [facility] [address], [place] [address] [facility] [address], [place] [address], [place] [address], [place] 401 [place], [place] [address], [place], [address] [place], [place] [address] [place] [address] [place], [place] [address] [place], [place] 2210 [place], [place] 605 [place], [place] 1040 [place] [place], [place] [address] [place]  [address], [place] [address] [place], [place] 1628 [place], [place] [address], [facility] [address]  [address] [place], [place] 4700 [place], [place] 1105 [place], [place] [address] [place], [place] 204 [place], [place] [address], [place] [address], [place] [address], [place] [address] [place] 1607 [place], [place] [address] [place], [place] [address] [place] 6310 [place], [place] 855 [place], [place] [address], [place] [address] [place], [place] [facility] [address] [place], [place] [address] [place], [place] [address], [place] [address]11 [place], [place] [address] [place], [place]  [address] [place], [place] [address] [place], [place] [address] [place], [place] [address], [place] 3703 [place], [place] [address] [place] [address], [employer] [address], [place] 7001 [place], [place] [address], [place] 2019 [place], [place] 2758 [place], [place] [address], [place] 904 [place], [place] [address], [place] 5005 [place], [place] [address] [place] [address], [place] [address] [place] [address] [place] [address], [place] You have been approved to have the prescriptions written by your discharging  [facility] [address], [place] 3141 [employer], [place] [address] [place], [place] [address] [place] [address], [place] [address] [place], [place] 3605 [place], [place] [address] [place], [place] [address] [place], [place] [address] [place], [place] [address] [place], [place] [address], [place] [address] [place] [address], [place] Other [facility] [address] [place], [place] [facility] [address] [facility] [address], [place]   Transition of Care Asessment: Insurance and Status: Selfpay Patient has primary care physician: No Home environment has been reviewed: Y Prior level of function:: Independent Prior/Current Home Services: No current home services Social Drivers of Health Review: SDOH reviewed no interventions necessary Readmission risk has been reviewed: Yes Transition of care needs: transition of care needs identified, TOC will continue to follow

## 2024-02-15 NOTE — Discharge Instructions (Addendum)
 Steven Mooney,  You were recently admitted to [facility] for esophagogastroduodenoscopy to evaluate if you had a piece of a tooth in your stomach.  This did not show any evidence that you have swallowed a piece of the tooth but it did show some irritation in the lining of your esophagus from acid reflux and irritation in your stomach.  The gastroenterologist would like you to take some medications to help with this.  They would also like you to follow-up with them in 8 weeks to repeat this test.  Continue taking your home medications with the following changes  Start taking Pantoprazole  40 mg twice daily  You should seek further medical care if you having trouble breathing, coughing up any blood, throwing up, or have any other worrisome symptoms.    Sincerely, Fairy Pool, DO

## 2024-02-15 NOTE — ED Provider Notes (Signed)
 [place] EMERGENCY DEPARTMENT AT [facility] Provider Note   CSN: [id number] Arrival date & time: 02/15/24  9366     Patient presents with: Medical problem   Steven Mooney is a 38 y.o. male.  Who returns to the ED for suspected foreign body ingestion.  Patient feels that he may have been grinding his teeth when he slept overnight.  No missing a portion of his right upper canine (tooth 6) and feels as though it is stuck in his throat.  He was here earlier this morning but had to leave to get his kids to school.  He was previously evaluated with a plan for bronchoscopy for potential foreign body retrieval.  Some throat discomfort no shortness of breath.  Able to swallow and tolerate secretions.   HPI     Prior to Admission medications   Medication Sig Start Date End Date Taking? Authorizing Provider  hydrOXYzine  (ATARAX ) 25 MG tablet Take 1 tablet (25 mg total) by mouth every 6 (six) hours as needed for anxiety. 07/30/23   Dean Clarity, MD  ibuprofen  (ADVIL ) 600 MG tablet Take 600 mg by mouth every 6 (six) hours as needed for moderate pain.    [provider]  omeprazole  (PRILOSEC) 20 MG capsule Take 1 capsule (20 mg total) by mouth daily. 08/31/23   Patsey Lot, MD  ondansetron  (ZOFRAN -ODT) 4 MG disintegrating tablet Take 1 tablet (4 mg total) by mouth every 8 (eight) hours as needed for nausea or vomiting. 02/07/24   Barrett, Jamie N, PA-C  pantoprazole  (PROTONIX ) 20 MG tablet Take 1 tablet (20 mg total) by mouth daily. 02/07/24   Barrett, Warren SAILOR, PA-C  sucralfate (CARAFATE) 1 g tablet Take 1 g by mouth 4 (four) times daily -  with meals and at bedtime.    [provider]    Allergies: Penicillins and Penicillins    Review of Systems  Updated Vital Signs BP 130/86   Pulse 64   Temp 98.2 F (36.8 C)   Resp 20   Ht 6' (1.829 m)   Wt 83.9 kg   SpO2 100%   BMI 25.09 kg/m   Physical Exam Vitals and nursing note reviewed.  HENT:     Head:  Normocephalic and atraumatic.     Mouth/Throat:     Comments: Poor dentition Tooth avulsion of tooth #6 Eyes:     Pupils: Pupils are equal, round, and reactive to light.  Cardiovascular:     Rate and Rhythm: Normal rate and regular rhythm.  Pulmonary:     Effort: Pulmonary effort is normal.     Breath sounds: Normal breath sounds.  Abdominal:     Palpations: Abdomen is soft.     Tenderness: There is no abdominal tenderness.  Skin:    General: Skin is warm and dry.  Neurological:     Mental Status: He is alert.  Psychiatric:        Mood and Affect: Mood normal.     (all labs ordered are listed, but only abnormal results are displayed) Labs Reviewed - No data to display  EKG: None  Radiology: [facility] Chest Port 1 View Result Date: 02/15/2024 CLINICAL DATA:  Shortness of breath, recently swallowed tooth, initial encounter EXAM: PORTABLE CHEST 1 VIEW COMPARISON:  None Available. FINDINGS: The heart size and mediastinal contours are within normal limits. Both lungs are clear. The visualized skeletal structures are unremarkable. Incidental note is made of right aortic arch stable from the prior exam. IMPRESSION: No  acute abnormality noted. No findings to correspond with the given clinical history of swallowed tooth are seen. Electronically Signed   By: Oneil Devonshire M.D.   On: 02/15/2024 01:36   DG Neck Soft Tissue Result Date: 02/15/2024 CLINICAL DATA:  Shortness of breath, recently swallowed tooth EXAM: NECK SOFT TISSUES - 1+ VIEW COMPARISON:  None Available. FINDINGS: There is no evidence of retropharyngeal soft tissue swelling or epiglottic enlargement. The cervical airway is unremarkable and no radio-opaque foreign body identified. IMPRESSION: No definitive radiopaque foreign body is identified. Lateral view may have been helpful. Electronically Signed   By: Oneil Devonshire M.D.   On: 02/15/2024 01:34     Procedures   Medications Ordered in the ED - No data to display  Clinical Course  as of 02/15/24 0814  Thu Feb 15, 2024  9256 Discussed with pulmonology team PA Benton (attending [place]) who will evaluate patient in ED and requests admission to medicine service.  Plan for bronchoscopy later this afternoon.  Pulmonology team will also discuss case with GI to see if endoscopy versus bronchoscopy would be more appropriate for foreign body visualization and retrieval [MP]  [phone] Discussed admitting hospitalist accept patient for admission [MP]    Clinical Course User Index [MP] Pamella Ozell LABOR, DO                                 Medical Decision Making 38 year old male with history as above returns for suspected foreign body ingestion/aspiration.  Tooth #6 partially avulsed.  Feels as though it stuck in his throat.  Able to swallow breathe and tolerate secretions.  Previously physician had evaluated patient and had plan for admission with bronchoscopy however patient had to leave prior to admission and completion of procedure to bring his children to school.  He is back and still feels sensation of foreign body in his throat.  No foreign body visualized on x-ray earlier but may be difficult to see given that it is quite small and would be part of the tooth.  Discussed with pulmonology/crit care who agrees with plan for bronchoscopy and request admission to medicine service.        Final diagnoses:  Tooth avulsion, initial encounter  Aspiration of foreign body, initial encounter    ED Discharge Orders     None          Webb, Weed, DO 02/15/24 [phone]

## 2024-02-15 NOTE — Anesthesia Postprocedure Evaluation (Signed)
 Anesthesia Post Note  Patient: Steven Mooney  Procedure(s) Performed: EGD (ESOPHAGOGASTRODUODENOSCOPY)     Patient location during evaluation: PACU Anesthesia Type: General Level of consciousness: awake and alert Pain management: pain level controlled Vital Signs Assessment: post-procedure vital signs reviewed and stable Respiratory status: spontaneous breathing, nonlabored ventilation and respiratory function stable Cardiovascular status: stable, blood pressure returned to baseline and tachycardic Anesthetic complications: no   No notable events documented.  Last Vitals:  Vitals:   02/15/24 1200 02/15/24 1210  BP: (!) 122/93 (!) 148/99  Pulse: (!) 103 (!) 107  Resp: (!) 22 18  Temp:    SpO2: 99% 98%    Last Pain:  Vitals:   02/15/24 1210  PainSc: 0-No pain                 Debby FORBES Like

## 2024-02-16 DIAGNOSIS — S032XXA Dislocation of tooth, initial encounter: Secondary | ICD-10-CM

## 2024-02-16 LAB — SURGICAL PATHOLOGY

## 2024-02-19 ENCOUNTER — Encounter (HOSPITAL_COMMUNITY): Payer: Self-pay | Admitting: Gastroenterology

## 2024-02-28 ENCOUNTER — Ambulatory Visit: Payer: Self-pay

## 2024-03-30 ENCOUNTER — Other Ambulatory Visit: Payer: Self-pay

## 2024-03-30 ENCOUNTER — Encounter (HOSPITAL_COMMUNITY): Payer: Self-pay | Admitting: *Deleted

## 2024-03-30 ENCOUNTER — Emergency Department (HOSPITAL_COMMUNITY)
Admission: EM | Admit: 2024-03-30 | Discharge: 2024-03-30 | Disposition: A | Discharge status: Home / Self Care | Payer: Self-pay | Attending: Emergency Medicine | Admitting: Emergency Medicine

## 2024-03-30 DIAGNOSIS — J449 Chronic obstructive pulmonary disease, unspecified: Secondary | ICD-10-CM | POA: Insufficient documentation

## 2024-03-30 DIAGNOSIS — F10920 Alcohol use, unspecified with intoxication, uncomplicated: Secondary | ICD-10-CM

## 2024-03-30 DIAGNOSIS — Y92481 Parking lot as the place of occurrence of the external cause: Secondary | ICD-10-CM | POA: Insufficient documentation

## 2024-03-30 DIAGNOSIS — S80211A Abrasion, right knee, initial encounter: Secondary | ICD-10-CM | POA: Insufficient documentation

## 2024-03-30 DIAGNOSIS — Y908 Blood alcohol level of 240 mg/100 ml or more: Secondary | ICD-10-CM | POA: Insufficient documentation

## 2024-03-30 DIAGNOSIS — W1830XA Fall on same level, unspecified, initial encounter: Secondary | ICD-10-CM | POA: Insufficient documentation

## 2024-03-30 DIAGNOSIS — S80212A Abrasion, left knee, initial encounter: Secondary | ICD-10-CM | POA: Insufficient documentation

## 2024-03-30 DIAGNOSIS — Z5329 Procedure and treatment not carried out because of patient's decision for other reasons: Secondary | ICD-10-CM | POA: Insufficient documentation

## 2024-03-30 DIAGNOSIS — F10120 Alcohol abuse with intoxication, uncomplicated: Secondary | ICD-10-CM | POA: Insufficient documentation

## 2024-03-30 DIAGNOSIS — R7401 Elevation of levels of liver transaminase levels: Secondary | ICD-10-CM | POA: Insufficient documentation

## 2024-03-30 DIAGNOSIS — Z91010 Allergy to peanuts: Secondary | ICD-10-CM | POA: Insufficient documentation

## 2024-03-30 HISTORY — DX: Alcohol abuse, uncomplicated: F10.10

## 2024-03-30 LAB — RAPID URINE DRUG SCREEN, HOSP PERFORMED
Amphetamines: NOT DETECTED
Barbiturates: NOT DETECTED
Benzodiazepines: POSITIVE — AB
Cocaine: NOT DETECTED
Opiates: NOT DETECTED
Tetrahydrocannabinol: POSITIVE — AB

## 2024-03-30 LAB — COMPREHENSIVE METABOLIC PANEL WITH GFR
ALT: 46 U/L — ABNORMAL HIGH (ref 0–44)
AST: 44 U/L — ABNORMAL HIGH (ref 15–41)
Albumin: 4.8 g/dL (ref 3.5–5.0)
Alkaline Phosphatase: 67 U/L (ref 38–126)
Anion gap: 15 (ref 5–15)
BUN: 6 mg/dL (ref 6–20)
CO2: 24 mmol/L (ref 22–32)
Calcium: 8.9 mg/dL (ref 8.9–10.3)
Chloride: 103 mmol/L (ref 98–111)
Creatinine, Ser: 1.34 mg/dL — ABNORMAL HIGH (ref 0.61–1.24)
GFR, Estimated: >60 mL/min (ref >60)
Glucose, Bld: 102 mg/dL — ABNORMAL HIGH (ref 70–99)
Potassium: 3.4 mmol/L — ABNORMAL LOW (ref 3.5–5.1)
Sodium: 142 mmol/L (ref 135–145)
Total Bilirubin: 0.7 mg/dL (ref 0.0–1.2)
Total Protein: 8.1 g/dL (ref 6.5–8.1)

## 2024-03-30 LAB — CBC
HCT: 43.7 % (ref 39.0–52.0)
Hemoglobin: 14.7 g/dL (ref 13.0–17.0)
MCH: 30.9 pg (ref 26.0–34.0)
MCHC: 33.6 g/dL (ref 30.0–36.0)
MCV: 92 fL (ref 80.0–100.0)
Platelets: 276 K/uL (ref 150–400)
RBC: 4.75 MIL/uL (ref 4.22–5.81)
RDW: 12.8 % (ref 11.5–15.5)
WBC: 6.6 K/uL (ref 4.0–10.5)
nRBC: 0 % (ref 0.0–0.2)

## 2024-03-30 LAB — ETHANOL: Alcohol, Ethyl (B): 388 mg/dL (ref <15)
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
## 2024-03-30 NOTE — ED Notes (Signed)
 Pt moving a lot during initial vitals being taken. Pt placed in hall bed across from nurses station, security at bedside.

## 2024-03-30 NOTE — Discharge Instructions (Addendum)
 Please follow-up with your PCP.  If you do not have 1 I have referred you to 1.  I have attached resources for substance abuse treatment centers.

## 2024-04-11 NOTE — Progress Notes (Deleted)
 Steven Console, Steven Mooney [address] [place], [place]  [address] Phone: [phone]   Primary Care Physician: Patient, No Pcp Per  Primary Gastroenterologist:  Steven Console, Steven Mooney / Glendia Holt, MD   Chief Complaint: Hospital follow-up reflux esophagitis       HPI:   Discussed the use of AI scribe software for clinical note transcription with the patient, who gave verbal consent to proceed.  38 year old male with history of COPD, alcohol use disorder, and gastric ulcers, presents for hospital follow-up of reflux esophagitis.  He has had numerous ED visits in the past year.  Most recently seen at [facility] ED 03/30/2024 for alcohol intoxication.  Ethanol level 388.  Urine drug screen positive for benzo and THC.  Normal CBC with Hgb 14.7.  Elevated AST 44, ALT 46, creatinine 1.34.  Potassium 3.4.  02/15/2024 was seen at [facility] ED to evaluate possible foreign body ingestion, possible swallowed tooth.  Chest x-ray unremarkable.  Neck x-ray showed no foreign body identified.  02/15/2024 EGD by Dr. Holt (to evaluate odynophagia and possible foreign body): LA grade D reflux esophagitis without bleeding.  Normal nasopharynx, oropharynx, and larynx.  4 cm hiatal hernia.  Mild gastritis.  Normal duodenum.  Gastric biopsies negative for H. pylori.  No evidence of any retained tooth fragment or other foreign body.  He was started on Protonix  40 mg twice daily for 8 weeks.  Recommend repeat EGD in 8 weeks to verify healing of severe reflux esophagitis.  History of Present Illness   02/07/2024 CT abdomen pelvis with contrast: Diffuse hepatic steatosis.  Normal gallbladder.  Small to moderate hiatal hernia.  No acute abnormality.  Current Outpatient Medications  Medication Sig Dispense Refill   alum & mag hydroxide-simeth (MAALOX PLUS) 400-400-40 MG/5ML suspension Take 10 mLs by mouth every 6 (six) hours as needed. 355 mL 0   Melaton-Thean-Cham-PassF-LBalm (SLEEP PO) Take 1 tablet by mouth at  bedtime as needed (sleep).     ondansetron  (ZOFRAN -ODT) 4 MG disintegrating tablet Take 1 tablet (4 mg total) by mouth every 8 (eight) hours as needed for nausea or vomiting. 20 tablet 0   pantoprazole  (PROTONIX ) 40 MG tablet Take 1 tablet (40 mg total) by mouth 2 (two) times daily. 120 tablet 0   No current facility-administered medications for this visit.    Allergies as of 04/12/2024 - Review Complete 03/30/2024  Allergen Reaction Noted   Banana Anaphylaxis 02/15/2024   Fish allergy Anaphylaxis 02/15/2024   Peach flavor [flavoring agent] Anaphylaxis 02/15/2024   Peanut-containing drug products Anaphylaxis 02/15/2024   Penicillins  09/19/2018    Past Medical History:  Diagnosis Date   COPD (chronic obstructive pulmonary disease) (HCC)    ETOH abuse    Gastric ulcer    Scoliosis     Past Surgical History:  Procedure Laterality Date   ESOPHAGOGASTRODUODENOSCOPY N/A 02/15/2024   Procedure: EGD (ESOPHAGOGASTRODUODENOSCOPY);  Surgeon: Holt Glendia BRAVO, MD;  Location[facility] ENDOSCOPY;  Service: Gastroenterology;  Laterality: N/A;    Review of Systems:    All systems reviewed and negative except where noted in HPI.    Physical Exam:  There were no vitals taken for this visit. No LMP for male patient.  General: Well-nourished, well-developed in no acute distress.  Lungs: Clear to auscultation bilaterally. Non-labored. Heart: Regular rate and rhythm, no murmurs rubs or gallops.  Abdomen: Bowel sounds are normal; Abdomen is Soft; No hepatosplenomegaly, masses or hernias;  No Abdominal Tenderness; No guarding or rebound tenderness. Neuro:  Alert and oriented x 3.  Grossly intact.  Psych: Alert and cooperative, normal mood and affect.   Imaging Studies: No results found.  Labs: CBC    Component Value Date/Time   WBC 6.6 03/30/2024 2154   RBC 4.75 03/30/2024 2154   HGB 14.7 03/30/2024 2154   HCT 43.7 03/30/2024 2154   PLT 276 03/30/2024 2154   MCV 92.0 03/30/2024 2154    MCH 30.9 03/30/2024 2154   MCHC 33.6 03/30/2024 2154   RDW 12.8 03/30/2024 2154   LYMPHSABS 1.7 02/07/2024 0637   MONOABS 0.6 02/07/2024 0637   EOSABS 0.1 02/07/2024 0637   BASOSABS 0.1 02/07/2024 0637    CMP     Component Value Date/Time   NA 142 03/30/2024 2154   K 3.4 (L) 03/30/2024 2154   CL 103 03/30/2024 2154   CO2 24 03/30/2024 2154   GLUCOSE 102 (H) 03/30/2024 2154   BUN 6 03/30/2024 2154   CREATININE 1.34 (H) 03/30/2024 2154   CALCIUM 8.9 03/30/2024 2154   PROT 8.1 03/30/2024 2154   ALBUMIN 4.8 03/30/2024 2154   AST 44 (H) 03/30/2024 2154   ALT 46 (H) 03/30/2024 2154   ALKPHOS 67 03/30/2024 2154   BILITOT 0.7 03/30/2024 2154   GFRNONAA >60 03/30/2024 2154   GFRAA >60 11/03/2018 1251       Assessment and Plan:   Steven Mooney is a 38 y.o. y/o male   1.  Severe LA grade D reflux esophagitis - Continue Protonix  - Repeat EGD to verify healing of esophagitis  2.  Alcohol use disorder - Resources discussed to help with alcohol cessation - Recommend alcoholic synonymous or inpatient rehab programs such as [facility]  3.  Elevated liver transaminases, likely secondary to alcohol use disorder and hepatic steatosis - Encouraged alcohol cessation - Recommend a low-fat diet, regular exercise, and weight loss. Patient education handout about fatty liver disease was given and discussed.  - Monitor LFTs - Check liver labs: Viral hepatitis A, B, C  Assessment and Plan Assessment & Plan       Steven Console, Steven Mooney  Follow up ***

## 2024-04-12 ENCOUNTER — Ambulatory Visit: Payer: Self-pay | Admitting: Physician Assistant
